# Patient Record
Sex: Female | Born: 1970 | Race: White | Hispanic: No | Marital: Married | Smoking: Current every day smoker
Health system: Southern US, Community
[De-identification: ages and names within clinical notes are randomized; demographics above are authoritative.]

## PROBLEM LIST (undated history)

## (undated) DIAGNOSIS — R87612 Low grade squamous intraepithelial lesion on cytologic smear of cervix (LGSIL): Secondary | ICD-10-CM

## (undated) DIAGNOSIS — G43909 Migraine, unspecified, not intractable, without status migrainosus: Secondary | ICD-10-CM

## (undated) DIAGNOSIS — N760 Acute vaginitis: Secondary | ICD-10-CM

## (undated) DIAGNOSIS — A630 Anogenital (venereal) warts: Secondary | ICD-10-CM

## (undated) DIAGNOSIS — E119 Type 2 diabetes mellitus without complications: Secondary | ICD-10-CM

## (undated) DIAGNOSIS — N871 Moderate cervical dysplasia: Secondary | ICD-10-CM

## (undated) DIAGNOSIS — R739 Hyperglycemia, unspecified: Secondary | ICD-10-CM

## (undated) DIAGNOSIS — E785 Hyperlipidemia, unspecified: Secondary | ICD-10-CM

## (undated) DIAGNOSIS — N63 Unspecified lump in unspecified breast: Secondary | ICD-10-CM

## (undated) DIAGNOSIS — N9089 Other specified noninflammatory disorders of vulva and perineum: Secondary | ICD-10-CM

## (undated) DIAGNOSIS — B9689 Other specified bacterial agents as the cause of diseases classified elsewhere: Secondary | ICD-10-CM

## (undated) DIAGNOSIS — R87619 Unspecified abnormal cytological findings in specimens from cervix uteri: Secondary | ICD-10-CM

## (undated) DIAGNOSIS — E559 Vitamin D deficiency, unspecified: Secondary | ICD-10-CM

## (undated) HISTORY — DX: Migraine, unspecified, not intractable, without status migrainosus: G43.909

## (undated) HISTORY — DX: Type 2 diabetes mellitus without complications: E11.9

## (undated) HISTORY — DX: Moderate cervical dysplasia: N87.1

## (undated) HISTORY — DX: Other specified bacterial agents as the cause of diseases classified elsewhere: B96.89

## (undated) HISTORY — PX: CERVICAL BIOPSY  W/ LOOP ELECTRODE EXCISION: SUR135

## (undated) HISTORY — DX: Low grade squamous intraepithelial lesion on cytologic smear of cervix (LGSIL): R87.612

## (undated) HISTORY — PX: COLPOSCOPY: SHX161

## (undated) HISTORY — DX: Hyperglycemia, unspecified: R73.9

## (undated) HISTORY — DX: Vitamin D deficiency, unspecified: E55.9

## (undated) HISTORY — DX: Unspecified abnormal cytological findings in specimens from cervix uteri: R87.619

## (undated) HISTORY — DX: Anogenital (venereal) warts: A63.0

## (undated) HISTORY — DX: Unspecified lump in unspecified breast: N63.0

## (undated) HISTORY — DX: Other specified bacterial agents as the cause of diseases classified elsewhere: N76.0

## (undated) HISTORY — DX: Other specified noninflammatory disorders of vulva and perineum: N90.89

## (undated) HISTORY — DX: Hyperlipidemia, unspecified: E78.5

---

## 1999-09-13 ENCOUNTER — Other Ambulatory Visit: Admission: RE | Admit: 1999-09-13 | Discharge: 1999-09-13 | Payer: Self-pay | Admitting: Family Medicine

## 2000-12-04 ENCOUNTER — Other Ambulatory Visit: Admission: RE | Admit: 2000-12-04 | Discharge: 2000-12-04 | Payer: Self-pay | Admitting: Family Medicine

## 2001-11-19 DIAGNOSIS — I10 Essential (primary) hypertension: Secondary | ICD-10-CM | POA: Insufficient documentation

## 2001-11-19 DIAGNOSIS — E785 Hyperlipidemia, unspecified: Secondary | ICD-10-CM | POA: Insufficient documentation

## 2002-06-20 DIAGNOSIS — F329 Major depressive disorder, single episode, unspecified: Secondary | ICD-10-CM | POA: Insufficient documentation

## 2004-01-17 ENCOUNTER — Ambulatory Visit: Payer: Self-pay | Admitting: Family Medicine

## 2005-03-01 ENCOUNTER — Ambulatory Visit: Payer: Self-pay | Admitting: Family Medicine

## 2005-06-19 ENCOUNTER — Encounter: Payer: Self-pay | Admitting: Family Medicine

## 2005-06-19 LAB — CONVERTED CEMR LAB: Pap Smear: NORMAL

## 2005-08-15 ENCOUNTER — Ambulatory Visit: Payer: Self-pay | Admitting: Family Medicine

## 2005-08-27 ENCOUNTER — Ambulatory Visit: Payer: Self-pay | Admitting: Family Medicine

## 2005-10-09 ENCOUNTER — Ambulatory Visit: Payer: Self-pay | Admitting: Family Medicine

## 2005-12-04 ENCOUNTER — Ambulatory Visit: Payer: Self-pay | Admitting: Family Medicine

## 2005-12-28 ENCOUNTER — Ambulatory Visit: Payer: Self-pay | Admitting: Family Medicine

## 2006-06-02 ENCOUNTER — Encounter: Payer: Self-pay | Admitting: Family Medicine

## 2006-06-03 ENCOUNTER — Ambulatory Visit: Payer: Self-pay | Admitting: Family Medicine

## 2006-06-03 ENCOUNTER — Encounter: Payer: Self-pay | Admitting: Family Medicine

## 2006-07-01 ENCOUNTER — Encounter: Payer: Self-pay | Admitting: Family Medicine

## 2006-09-04 ENCOUNTER — Encounter (INDEPENDENT_AMBULATORY_CARE_PROVIDER_SITE_OTHER): Payer: Self-pay | Admitting: *Deleted

## 2006-09-05 ENCOUNTER — Ambulatory Visit: Payer: Self-pay | Admitting: Family Medicine

## 2006-09-05 LAB — CONVERTED CEMR LAB
BUN: 8 mg/dL (ref 6–23)
CO2: 26 meq/L (ref 19–32)
Calcium: 8.7 mg/dL (ref 8.4–10.5)
Chloride: 105 meq/L (ref 96–112)
Cholesterol: 182 mg/dL (ref 0–200)
Creatinine, Ser: 0.7 mg/dL (ref 0.4–1.2)
GFR calc Af Amer: 122 mL/min
GFR calc non Af Amer: 101 mL/min
Glucose, Bld: 102 mg/dL — ABNORMAL HIGH (ref 70–99)
HDL: 30.5 mg/dL — ABNORMAL LOW (ref >39.0)
LDL Cholesterol: 116 mg/dL — ABNORMAL HIGH (ref 0–99)
Potassium: 4.3 meq/L (ref 3.5–5.1)
Sodium: 138 meq/L (ref 135–145)
TSH: 1.1 microintl units/mL (ref 0.35–5.50)
Total CHOL/HDL Ratio: 6
Triglycerides: 179 mg/dL — ABNORMAL HIGH (ref 0–149)
VLDL: 36 mg/dL (ref 0–40)

## 2006-09-09 ENCOUNTER — Ambulatory Visit: Payer: Self-pay | Admitting: Family Medicine

## 2009-09-26 ENCOUNTER — Encounter (INDEPENDENT_AMBULATORY_CARE_PROVIDER_SITE_OTHER): Payer: Self-pay | Admitting: *Deleted

## 2010-03-21 NOTE — Letter (Signed)
Summary: Lori Ray letter  [place] at [facility]  [address] [place], [place] [address]   Phone: [phone]  Fax: [phone]       09/26/2009 MRN: [id number]  Lori Ray [address] [place], [place]  [address]  Dear Ms. [facility] [place], and [place] announce the retirement of Arta Silence, M.D., from full-time practice at the [facility] office effective August 18, 2009 and his plans of returning part-time.  It is important to Dr. Hetty Ely and to our practice that you understand that [facility] has seven physicians in our office for your health care needs.  We will continue to offer the same exceptional care that you have today.    Dr. Hetty Ely has spoken to many of you about his plans for retirement and returning part-time in the fall.   We will continue to work with you through the transition to schedule appointments for you in the office and meet the high standards that [facility] is committed to.   Again, it is with great pleasure that we share the news that Dr. Hetty Ely will return to [facility] in October of 2011 with a reduced schedule.    If you have any questions, or would like to request an appointment with one of our physicians, please call us at [phone] and press the option for Scheduling an appointment.  We take pleasure in providing you with excellent patient care and look forward to seeing you at your next office visit.  Our [facility] Physicians are:  Tillman Abide, M.D. Laurita Quint, M.D. Roxy Manns, M.D. Kerby Nora, M.D. Hannah Beat, M.D. Ruthe Mannan, M.D. We proudly welcomed Raechel Ache, M.D. and Eustaquio Boyden, M.D. to the practice in July/August 2011.  Sincerely,  Lori Ray

## 2010-05-03 ENCOUNTER — Ambulatory Visit: Payer: Self-pay | Admitting: Unknown Physician Specialty

## 2010-05-15 ENCOUNTER — Ambulatory Visit: Payer: Self-pay | Admitting: Unknown Physician Specialty

## 2011-02-20 HISTORY — PX: BREAST CYST ASPIRATION: SHX578

## 2011-07-04 ENCOUNTER — Ambulatory Visit: Payer: Self-pay | Admitting: General Surgery

## 2015-09-05 ENCOUNTER — Other Ambulatory Visit: Payer: Self-pay | Admitting: Obstetrics and Gynecology

## 2015-09-22 ENCOUNTER — Other Ambulatory Visit: Payer: Self-pay | Admitting: Obstetrics and Gynecology

## 2015-09-22 DIAGNOSIS — Z1231 Encounter for screening mammogram for malignant neoplasm of breast: Secondary | ICD-10-CM

## 2015-10-13 ENCOUNTER — Other Ambulatory Visit: Payer: Self-pay | Admitting: Obstetrics and Gynecology

## 2015-10-13 ENCOUNTER — Ambulatory Visit
Admission: RE | Admit: 2015-10-13 | Discharge: 2015-10-13 | Disposition: A | Discharge status: Home / Self Care | Payer: Managed Care, Other (non HMO) | Source: Ambulatory Visit | Attending: Obstetrics and Gynecology | Admitting: Obstetrics and Gynecology

## 2015-10-13 DIAGNOSIS — Z1231 Encounter for screening mammogram for malignant neoplasm of breast: Secondary | ICD-10-CM

## 2016-10-16 ENCOUNTER — Ambulatory Visit: Payer: Self-pay | Admitting: Obstetrics and Gynecology

## 2016-11-27 ENCOUNTER — Ambulatory Visit: Payer: Self-pay | Admitting: Obstetrics and Gynecology

## 2017-01-02 ENCOUNTER — Encounter: Payer: Self-pay | Admitting: Obstetrics and Gynecology

## 2017-01-02 ENCOUNTER — Ambulatory Visit (INDEPENDENT_AMBULATORY_CARE_PROVIDER_SITE_OTHER): Payer: Managed Care, Other (non HMO) | Admitting: Obstetrics and Gynecology

## 2017-01-02 VITALS — BP 128/88 | Ht 65.0 in | Wt 288.0 lb

## 2017-01-02 DIAGNOSIS — Z01419 Encounter for gynecological examination (general) (routine) without abnormal findings: Secondary | ICD-10-CM

## 2017-01-02 DIAGNOSIS — Z716 Tobacco abuse counseling: Secondary | ICD-10-CM | POA: Insufficient documentation

## 2017-01-02 DIAGNOSIS — R635 Abnormal weight gain: Secondary | ICD-10-CM | POA: Diagnosis not present

## 2017-01-02 DIAGNOSIS — Z124 Encounter for screening for malignant neoplasm of cervix: Secondary | ICD-10-CM

## 2017-01-02 DIAGNOSIS — Z1151 Encounter for screening for human papillomavirus (HPV): Secondary | ICD-10-CM

## 2017-01-02 DIAGNOSIS — N912 Amenorrhea, unspecified: Secondary | ICD-10-CM

## 2017-01-02 DIAGNOSIS — Z1231 Encounter for screening mammogram for malignant neoplasm of breast: Secondary | ICD-10-CM | POA: Diagnosis not present

## 2017-01-02 DIAGNOSIS — Z1239 Encounter for other screening for malignant neoplasm of breast: Secondary | ICD-10-CM

## 2017-01-02 MED ORDER — MEDROXYPROGESTERONE ACETATE 10 MG PO TABS: 10.0000 mg | ORAL_TABLET | Freq: Every day | ORAL | 0 refills | Status: DC

## 2017-01-02 NOTE — Progress Notes (Signed)
PCP:  Ria Bush, MD   Chief Complaint  Patient presents with  . Annual Exam     HPI:      [name] is a 46 y.o. G0P0000 who LMP was Patient's last menstrual period was 12/21/2015., presents today for her annual examination.  Her menses are absent since 11/17. She was on OCPs until 7/18 and hasn't had a period off OCPs either. No BTB/spotting. She is having hot flashes. She has gained 22# since last yr. Hx of menorrhagia in the past.  Sex activity: single partner, contraception - vasectomy.  Last Pap: September 03, 2014  Results were: no abnormalities /neg HPV DNA. 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 Her mom is BRCA neg. There is no FH of ovarian cancer. The patient does do self-breast exams.  Tobacco use: The patient currently smokes 1 packs of cigarettes per day for the past many years. She quit for a few months last yr and then restarted. She knows she needs to quit. Alcohol use: none No drug use.  Exercise: not active  She does get adequate calcium and Vitamin D in her diet.  She has a hx of type 2 DM and hyperlipidemia and was on metformin and lovastatin but she lost a good bit of wt about 3 yrs ago. She has not been on meds in awhile. Recent labs though work showed HgA1 C of 5.7% and lipids were TC=209, TG=161, HDL=40, LDL=139.   Past Medical History:  Diagnosis Date  . Abnormal Pap smear of cervix   . Bacterial vaginosis   . Breast mass   . CIN II (cervical intraepithelial neoplasia II)   . Diabetes mellitus ([place])   . HPV (human papilloma virus) anogenital infection   . Hyperlipidemia   . LGSIL on Pap smear of cervix   . Migraine   . Vitamin D deficiency   . Vulvar lesion     Past Surgical History:  Procedure Laterality Date  . BREAST CYST ASPIRATION Left 2013  . CERVICAL BIOPSY  W/  LOOP ELECTRODE EXCISION    . COLPOSCOPY      Family History  Problem Relation Age of Onset  . Breast cancer Mother 24  . Congestive Heart Failure Father   . Diabetes Mellitus II Father   . Hypertension Father   . Rectal cancer Paternal Grandmother     Social History   Socioeconomic History  . Marital status: Single    Spouse name: Not on file  . Number of children: Not on file  . Years of education: Not on file  . Highest education level: Not on file  Social Needs  . Financial resource strain: Not on file  . Food insecurity - worry: Not on file  . Food insecurity - inability: Not on file  . Transportation needs - medical: Not on file  . Transportation needs - non-medical: Not on file  Occupational History  . Not on file  Tobacco Use  . Smoking status: Current Every Day Smoker  . Smokeless tobacco: Never Used  Substance and Sexual Activity  . Alcohol use: Yes  . Drug use: No  . Sexual activity: Yes    Birth control/protection: None  Other Topics Concern  . Not on file  Social History Narrative  . Not on  file    Current Meds  Medication Sig  . Cholecalciferol (VITAMIN D) 2000 units CAPS Take by mouth.  . Multiple Vitamin (MULTI-VITAMIN DAILY PO) Take by mouth.     ROS:  Review of Systems  Constitutional: Negative for fatigue, fever and unexpected weight change.  Respiratory: Negative for cough, shortness of breath and wheezing.   Cardiovascular: Negative for chest pain, palpitations and leg swelling.  Gastrointestinal: Negative for blood in stool, constipation, diarrhea, nausea and vomiting.  Endocrine: Negative for cold intolerance, heat intolerance and polyuria.  Genitourinary: Negative for dyspareunia, dysuria, flank pain, frequency, genital sores, hematuria, menstrual problem, pelvic pain, urgency, vaginal bleeding, vaginal discharge and vaginal pain.  Musculoskeletal: Negative for back pain, joint swelling and myalgias.  Skin: Negative for rash.    Neurological: Negative for dizziness, syncope, light-headedness, numbness and headaches.  Hematological: Negative for adenopathy.  Psychiatric/Behavioral: Negative for agitation, confusion, sleep disturbance and suicidal ideas. The patient is not nervous/anxious.      Objective: BP 128/88   Ht 5' 5"  (1.651 m)   Wt 288 lb (130.6 kg)   LMP 12/21/2015   BMI 47.93 kg/m    Physical Exam  Constitutional: She is oriented to person, place, and time. She appears well-developed and well-nourished.  Genitourinary: Vagina normal and uterus normal. There is no rash or tenderness on the right labia. There is no rash or tenderness on the left labia. No erythema or tenderness in the vagina. No vaginal discharge found. Right adnexum does not display mass and does not display tenderness. Left adnexum does not display mass and does not display tenderness. Cervix does not exhibit motion tenderness or polyp. Uterus is not enlarged or tender.  Neck: Normal range of motion. No thyromegaly present.  Cardiovascular: Normal rate, regular rhythm and normal heart sounds.  No murmur heard. Pulmonary/Chest: Effort normal and breath sounds normal. Right breast exhibits no mass, no nipple discharge, no skin change and no tenderness. Left breast exhibits no mass, no nipple discharge, no skin change and no tenderness.  Abdominal: Soft. There is no tenderness. There is no guarding.  Musculoskeletal: Normal range of motion.  Neurological: She is alert and oriented to person, place, and time. No cranial nerve deficit.  Psychiatric: She has a normal mood and affect. Her behavior is normal.  Vitals reviewed.   Assessment/Plan: Encounter for annual routine gynecological examination  Cervical cancer screening - Plan: IGP, Aptima HPV  Screening for HPV (human papillomavirus) - Plan: IGP, Aptima HPV  Screening for breast cancer - Pt to sched mammo. - Plan: MM SCREENING BREAST TOMO BILATERAL  Amenorrhea - Question  related to wt gain vs perimenopause. Try Rx provera challenge. If no menses, perimenopausal. If has withdrawal bleed, can restart OCPs vs provera Q3 mo - Plan: TSH, Prolactin, medroxyPROGESTERone (PROVERA) 10 MG tablet  Weight gain - Discussed diet/exercise/wt loss. Rechk labs next yr.  Encounter for tobacco use cessation counseling - Encouraged cessation.  Check labs.          GYN counsel mammography screening, menopause, adequate intake of calcium and vitamin D, diet and exercise     F/U  Return in about 1 year (around 01/02/2018).  Glen Kesinger B. Gisela Lea, PA-C 01/02/2017 11:05 AM

## 2017-01-02 NOTE — Patient Instructions (Signed)
I value your feedback and entrusting us with your care. If you get a [facility] patient survey, I would appreciate you taking the time to let us know about your experience today. Thank you! 

## 2017-01-03 LAB — TSH: TSH: 0.934 u[IU]/mL (ref 0.450–4.500)

## 2017-01-03 LAB — PROLACTIN: PROLACTIN: 7.1 ng/mL (ref 4.8–23.3)

## 2017-01-04 LAB — IGP, APTIMA HPV
HPV APTIMA: NEGATIVE
PAP Smear Comment: 0

## 2017-02-08 ENCOUNTER — Encounter: Payer: Self-pay | Admitting: Obstetrics and Gynecology

## 2017-02-08 ENCOUNTER — Ambulatory Visit
Admission: RE | Admit: 2017-02-08 | Discharge: 2017-02-08 | Disposition: A | Discharge status: Home / Self Care | Payer: Managed Care, Other (non HMO) | Source: Ambulatory Visit | Attending: Obstetrics and Gynecology | Admitting: Obstetrics and Gynecology

## 2017-02-08 DIAGNOSIS — Z1231 Encounter for screening mammogram for malignant neoplasm of breast: Secondary | ICD-10-CM | POA: Insufficient documentation

## 2017-02-08 DIAGNOSIS — Z1239 Encounter for other screening for malignant neoplasm of breast: Secondary | ICD-10-CM

## 2017-02-25 ENCOUNTER — Telehealth: Payer: Self-pay

## 2017-02-25 DIAGNOSIS — N912 Amenorrhea, unspecified: Secondary | ICD-10-CM

## 2017-02-25 MED ORDER — MEDROXYPROGESTERONE ACETATE 10 MG PO TABS: 10.0000 mg | ORAL_TABLET | Freq: Every day | ORAL | 0 refills | Status: DC

## 2017-02-25 NOTE — Telephone Encounter (Signed)
Pt completed course of Provera. Had a light 3 day cycle beginning 01/29/17. Nothing since. Pt inquiring next steps.

## 2017-02-25 NOTE — Telephone Encounter (Signed)
[facility] rx'd med in December & asked her to call back to f/u with how she was doing. Cb#[phone]

## 2017-02-25 NOTE — Telephone Encounter (Signed)
Pt without menses on OCPs since 11/17. Stopped them 7/18 without any bleeding since d/c'd. Pt with wt gain and given age, Rx provera challenge given to pt. Pt with 3 days light bleeding 01/29/17. Discussed OCPs again vs Q92mo provera. Pt elects to do provera. Rx eRxd. F/u prn.

## 2017-12-26 ENCOUNTER — Other Ambulatory Visit: Payer: Self-pay | Admitting: Obstetrics and Gynecology

## 2017-12-26 DIAGNOSIS — Z1231 Encounter for screening mammogram for malignant neoplasm of breast: Secondary | ICD-10-CM

## 2018-01-06 ENCOUNTER — Ambulatory Visit: Payer: Managed Care, Other (non HMO) | Admitting: Obstetrics and Gynecology

## 2018-02-10 ENCOUNTER — Ambulatory Visit
Admission: RE | Admit: 2018-02-10 | Discharge: 2018-02-10 | Disposition: A | Discharge status: Home / Self Care | Payer: 59 | Source: Ambulatory Visit | Attending: Obstetrics and Gynecology | Admitting: Obstetrics and Gynecology

## 2018-02-10 ENCOUNTER — Encounter: Payer: Self-pay | Admitting: Obstetrics and Gynecology

## 2018-02-10 DIAGNOSIS — Z1231 Encounter for screening mammogram for malignant neoplasm of breast: Secondary | ICD-10-CM | POA: Insufficient documentation

## 2018-03-03 ENCOUNTER — Ambulatory Visit: Payer: Managed Care, Other (non HMO) | Admitting: Obstetrics and Gynecology

## 2018-06-09 ENCOUNTER — Encounter: Payer: Self-pay | Admitting: Obstetrics and Gynecology

## 2018-07-28 ENCOUNTER — Ambulatory Visit (INDEPENDENT_AMBULATORY_CARE_PROVIDER_SITE_OTHER): Payer: 59 | Admitting: Obstetrics and Gynecology

## 2018-07-28 ENCOUNTER — Encounter: Payer: Self-pay | Admitting: Obstetrics and Gynecology

## 2018-07-28 ENCOUNTER — Other Ambulatory Visit (HOSPITAL_COMMUNITY)
Admission: RE | Admit: 2018-07-28 | Discharge: 2018-07-28 | Disposition: A | Discharge status: Home / Self Care | Payer: 59 | Source: Ambulatory Visit | Attending: Obstetrics and Gynecology | Admitting: Obstetrics and Gynecology

## 2018-07-28 ENCOUNTER — Other Ambulatory Visit: Payer: Self-pay

## 2018-07-28 VITALS — BP 118/90 | Ht 65.0 in | Wt 251.8 lb

## 2018-07-28 DIAGNOSIS — Z87410 Personal history of cervical dysplasia: Secondary | ICD-10-CM | POA: Diagnosis present

## 2018-07-28 DIAGNOSIS — Z01419 Encounter for gynecological examination (general) (routine) without abnormal findings: Secondary | ICD-10-CM | POA: Diagnosis not present

## 2018-07-28 DIAGNOSIS — Z124 Encounter for screening for malignant neoplasm of cervix: Secondary | ICD-10-CM

## 2018-07-28 DIAGNOSIS — Z1151 Encounter for screening for human papillomavirus (HPV): Secondary | ICD-10-CM | POA: Diagnosis present

## 2018-07-28 DIAGNOSIS — Z1239 Encounter for other screening for malignant neoplasm of breast: Secondary | ICD-10-CM

## 2018-07-28 DIAGNOSIS — N912 Amenorrhea, unspecified: Secondary | ICD-10-CM

## 2018-07-28 DIAGNOSIS — R102 Pelvic and perineal pain: Secondary | ICD-10-CM

## 2018-07-28 MED ORDER — MEDROXYPROGESTERONE ACETATE 10 MG PO TABS: 10.0000 mg | ORAL_TABLET | Freq: Every day | ORAL | 0 refills | Status: DC

## 2018-07-28 NOTE — Progress Notes (Signed)
PCP:  Chad Cordial, PA-C   Chief Complaint  Patient presents with  . Gynecologic Exam     HPI:      [name] is a 48 y.o. G0P0000 who LMP was Patient's last menstrual period was 05/14/2018 (approximate)., presents today for her annual examination.  Her menses were absent on OCPs since 11/17, but she didn't resume regular menses off OCPs once stopped 7/18. Had normal thyroid/pituitary labs 11/18 and had bleeding with provera challenge then. Since then, pt has been doing provera Q90 days if no spontaneous menses (didn't want to go back on OCPs). She is doing well with this. Menses last 4-5 days, normal flow, no BTB, no dysmen. Has lost 37# since we last saw her 11/18. Hx of menorrhagia in the past.  Sex activity: not currently active; partner with vasectomy in past Last Pap: 01/02/17 Results were: no abnormalities /neg HPV DNA. 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 Area was tender but improved. Sx recurred after sitting again in hard chair again 2/20. Sx come and go but sitting for extended time triggers sx. Pt feels like there is a hard area vaginally. Concerned about cystocele. Not sex active. No urin, bowel sx.  Last mammogram: 02/10/18  Results were: normal--routine follow-up in 12 months There is a FH of breast cancer in her mom at age 1. Her mom is BRCA neg. There is no FH of ovarian cancer. The patient does do self-breast exams.  Tobacco use: The patient currently smokes 1 packs of cigarettes per day for the past many years. She quit for a few months in past and then restarted. She knows she needs to quit. Alcohol use: none No drug use.  Exercise: mod active  She does get adequate calcium and Vitamin D in her diet.  She has a hx of type 2 DM and hyperlipidemia and was on metformin and lovastatin in past but lost a good bit of wt about 4 yrs ago. She has not been on  meds in awhile. Gets labs yearly at work (last one 11/19) which were normal.   Past Medical History:  Diagnosis Date  . Abnormal Pap smear of cervix   . Bacterial vaginosis   . Breast mass   . CIN II (cervical intraepithelial neoplasia II)   . Diabetes mellitus ([place])   . HPV (human papilloma virus) anogenital infection   . Hyperlipidemia   . LGSIL on Pap smear of cervix   . Migraine   . Vitamin D deficiency   . Vulvar lesion     Past Surgical History:  Procedure Laterality Date  . BREAST CYST ASPIRATION Left 2013  . CERVICAL BIOPSY  W/ LOOP ELECTRODE EXCISION    . COLPOSCOPY      Family History  Problem Relation Age of Onset  . Breast cancer Mother 73  . Congestive Heart Failure Father   . Diabetes Mellitus II Father   . Hypertension Father   . Rectal cancer Paternal Grandmother     Social History   Socioeconomic History  . Marital status: Single    Spouse name: Not on file  . Number of children: Not on file  . Years of education: Not on file  . Highest education level: Not on file  Occupational History  . Not on file  Social Needs  . Financial resource strain: Not  on file  . Food insecurity:    Worry: Not on file    Inability: Not on file  . Transportation needs:    Medical: Not on file    Non-medical: Not on file  Tobacco Use  . Smoking status: Current Every Day Smoker  . Smokeless tobacco: Never Used  Substance and Sexual Activity  . Alcohol use: Not Currently  . Drug use: No  . Sexual activity: Not Currently    Birth control/protection: None  Lifestyle  . Physical activity:    Days per week: Not on file    Minutes per session: Not on file  . Stress: Not on file  Relationships  . Social connections:    Talks on phone: Not on file    Gets together: Not on file    Attends religious service: Not on file    Active member of club or organization: Not on file    Attends meetings of clubs or organizations: Not on file    Relationship status: Not on  file  . Intimate partner violence:    Fear of current or ex partner: Not on file    Emotionally abused: Not on file    Physically abused: Not on file    Forced sexual activity: Not on file  Other Topics Concern  . Not on file  Social History Narrative  . Not on file    Current Meds  Medication Sig  . Cholecalciferol (VITAMIN D) 2000 units CAPS Take by mouth.  . Multiple Vitamin (MULTI-VITAMIN DAILY PO) Take by mouth.     ROS:  Review of Systems  Constitutional: Negative for fatigue, fever and unexpected weight change.  Respiratory: Negative for cough, shortness of breath and wheezing.   Cardiovascular: Negative for chest pain, palpitations and leg swelling.  Gastrointestinal: Negative for blood in stool, constipation, diarrhea, nausea and vomiting.  Endocrine: Negative for cold intolerance, heat intolerance and polyuria.  Genitourinary: Negative for dyspareunia, dysuria, flank pain, frequency, genital sores, hematuria, menstrual problem, pelvic pain, urgency, vaginal bleeding, vaginal discharge and vaginal pain.  Musculoskeletal: Negative for back pain, joint swelling and myalgias.  Skin: Negative for rash.  Neurological: Negative for dizziness, syncope, light-headedness, numbness and headaches.  Hematological: Negative for adenopathy.  Psychiatric/Behavioral: Negative for agitation, confusion, sleep disturbance and suicidal ideas. The patient is not nervous/anxious.      Objective: BP 118/90   Ht 5' 5" (1.651 m)   Wt 251 lb 12.8 oz (114.2 kg)   LMP 05/14/2018 (Approximate)   BMI 41.90 kg/m    Physical Exam Constitutional:      Appearance: She is well-developed.  Genitourinary:     Vulva, vagina, cervix, uterus, right adnexa and left adnexa normal.     No vulval lesion or tenderness noted.     No vaginal discharge, erythema or tenderness.     No cervical polyp.     Uterus is not enlarged or tender.     No right or left adnexal mass present.     Right adnexa not  tender.     Left adnexa not tender.     Genitourinary Comments: NO EVID OF CYSTOCELE/RECTOCELE/UTERINE PROLAPSE WITH VALSALVA; NO BULGE IDENTIFIED ON EXAM  Neck:     Musculoskeletal: Normal range of motion.     Thyroid: No thyromegaly.  Cardiovascular:     Rate and Rhythm: Normal rate and regular rhythm.     Heart sounds: Normal heart sounds. No murmur.  Pulmonary:     Effort: Pulmonary effort is  normal.     Breath sounds: Normal breath sounds.  Chest:     Breasts:        Right: No mass, nipple discharge, skin change or tenderness.        Left: No mass, nipple discharge, skin change or tenderness.  Abdominal:     Palpations: Abdomen is soft.     Tenderness: There is no abdominal tenderness. There is no guarding.  Musculoskeletal: Normal range of motion.  Neurological:     General: No focal deficit present.     Mental Status: She is alert and oriented to person, place, and time.     Cranial Nerves: No cranial nerve deficit.  Skin:    General: Skin is warm and dry.  Psychiatric:        Mood and Affect: Mood normal.        Behavior: Behavior normal.        Thought Content: Thought content normal.        Judgment: Judgment normal.  Vitals signs reviewed.     Assessment/Plan: Encounter for annual routine gynecological examination  Cervical cancer screening - Plan: Cytology - PAP  Screening for HPV (human papillomavirus) - Plan: Cytology - PAP  History of cervical dysplasia - Plan: Cytology - PAP  Screening for breast cancer - Mammo due 12/20 - Plan: MM 3D SCREEN BREAST BILATERAL  Amenorrhea - Question related to wt gain vs perimenopause. Cont provera Q3 mo if no spontaneous menses, Rx eRxd. - Plan: medroxyPROGESTERone (PROVERA) 10 MG tablet  Vaginal pain - Normal gyn exam. No evid of pelvic relaxation/prolapse/cystocele/rectocele. Question pelvic floor MSK sx. Recommend stretches. Can refer to PT prn.  Check labs.          GYN counsel mammography screening, menopause,  adequate intake of calcium and vitamin D, diet and exercise     F/U  Return in about 1 year (around 07/28/2019).  Alicia B. Copland, PA-C 07/28/2018 3:03 PM

## 2018-07-28 NOTE — Patient Instructions (Signed)
I value your feedback and entrusting us with your care. If you get a [facility] patient survey, I would appreciate you taking the time to let us know about your experience today. Thank you! 

## 2018-07-31 LAB — CYTOLOGY - PAP
Diagnosis: NEGATIVE
HPV: NOT DETECTED

## 2019-02-16 ENCOUNTER — Ambulatory Visit
Admission: RE | Admit: 2019-02-16 | Discharge: 2019-02-16 | Disposition: A | Discharge status: Home / Self Care | Payer: Managed Care, Other (non HMO) | Source: Ambulatory Visit | Attending: Obstetrics and Gynecology | Admitting: Obstetrics and Gynecology

## 2019-02-16 ENCOUNTER — Encounter: Payer: Self-pay | Admitting: Obstetrics and Gynecology

## 2019-02-16 DIAGNOSIS — Z1239 Encounter for other screening for malignant neoplasm of breast: Secondary | ICD-10-CM

## 2019-02-16 DIAGNOSIS — Z1231 Encounter for screening mammogram for malignant neoplasm of breast: Secondary | ICD-10-CM | POA: Insufficient documentation

## 2019-07-14 ENCOUNTER — Other Ambulatory Visit: Payer: Self-pay | Admitting: Obstetrics and Gynecology

## 2019-07-14 DIAGNOSIS — N912 Amenorrhea, unspecified: Secondary | ICD-10-CM

## 2019-07-28 NOTE — Progress Notes (Signed)
PCP:  Chad Cordial, PA-C   Chief Complaint  Patient presents with   Gynecologic Exam     HPI:      Lori Ray is a 49 y.o. G0P0000 who LMP was Patient's last menstrual period was 05/25/2019 (approximate)., presents today for her annual examination.  Her menses are Q3 months with provera challenge, lasting 2 days, very light, no dysmen, no BTB.  Had been amenorrheic on OCPs 2017 but no menses off pills. Tried prog challenge and pt had withdrawal bleed. She didn't want to restart OCPs, so cont Q3 months provera at this time given age/BMI.  Hx of menorrhagia in the past.  Sex activity: currently active; partner with vasectomy. Has small cyst on LT labia minora. Stable.  Last Pap: 07/28/18 Results were: no abnormalities /neg HPV DNA. 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 Neg exam last yr. Pt states sx resolved except when a little constipated and is more rectal in nature. Sx resolve if she has BM. No urin or bowel issues.  Last mammogram: 02/16/19  Results were: normal--routine follow-up in 12 months There is a FH of breast cancer in her mom at age 67. Her mom is BRCA neg. There is no FH of ovarian cancer. The patient does do self-breast exams.  Tobacco use: The patient currently smokes 1/2 ppd, knows she needs to quit. Alcohol use: none No drug use.  Exercise: min active. Having knee pain.  Colonoscopy: never  She does get adequate calcium and Vitamin D in her diet.  She has a hx of type 2 DM and hyperlipidemia and was on metformin and lovastatin in past but lost a good bit of wt about 5 yrs ago. She has not been on meds in awhile. Gets labs yearly at work (last one 2020) which were normal. Plans to do soon.  Tried to give blood recently and was denied due to HgB. Take MVI with iron daily.   Past Medical History:  Diagnosis Date   Abnormal Pap smear of cervix     Bacterial vaginosis    Breast mass    CIN II (cervical intraepithelial neoplasia II)    Diabetes mellitus (HCC)    HPV (human papilloma virus) anogenital infection    Hyperlipidemia    LGSIL on Pap smear of cervix    Migraine    Vitamin D deficiency    Vulvar lesion     Past Surgical History:  Procedure Laterality Date   BREAST CYST ASPIRATION Left 2013   CERVICAL BIOPSY  W/ LOOP ELECTRODE EXCISION     COLPOSCOPY      Family History  Problem Relation Age of Onset   Breast cancer Mother 33   Congestive Heart Failure Father    Diabetes Mellitus II Father    Hypertension Father    Rectal cancer Paternal Grandmother     Social History   Socioeconomic History   Marital status: Single    Spouse name: Not on file   Number of children: Not on file   Years of education: Not on file   Highest education level: Not on file  Occupational History   Not on file  Tobacco Use   Smoking status: Current Every Day Smoker   Smokeless tobacco: Never Used  Substance and Sexual Activity   Alcohol use: Not Currently   Drug use: No  Sexual activity: Yes    Birth control/protection: None  Other Topics Concern   Not on file  Social History Narrative   Not on file   Social Determinants of Health   Financial Resource Strain:    Difficulty of Paying Living Expenses:   Food Insecurity:    Worried About Charity fundraiser in the Last Year:    Arboriculturist in the Last Year:   Transportation Needs:    Film/video editor (Medical):    Lack of Transportation (Non-Medical):   Physical Activity:    Days of Exercise per Week:    Minutes of Exercise per Session:   Stress:    Feeling of Stress :   Social Connections:    Frequency of Communication with Friends and Family:    Frequency of Social Gatherings with Friends and Family:    Attends Religious Services:    Active Member of Clubs or Organizations:    Attends Arts administrator:    Marital Status:   Intimate Partner Violence:    Fear of Current or Ex-Partner:    Emotionally Abused:    Physically Abused:    Sexually Abused:     Current Meds  Medication Sig   Cholecalciferol (VITAMIN D) 2000 units CAPS Take by mouth.   medroxyPROGESTERone (PROVERA) 10 MG tablet TAKE 1 TABLET BY MOUTH ONCE DAILY FOR 7 DAYS EVERY  90  DAYS  IF  NO  MENSES   Multiple Vitamin (MULTI-VITAMIN DAILY PO) Take by mouth.   [DISCONTINUED] medroxyPROGESTERone (PROVERA) 10 MG tablet TAKE 1 TABLET BY MOUTH ONCE DAILY FOR 7 DAYS EVERY  90  DAYS  IF  NO  MENSES     ROS:  Review of Systems  Constitutional: Negative for fatigue, fever and unexpected weight change.  Respiratory: Negative for cough, shortness of breath and wheezing.   Cardiovascular: Negative for chest pain, palpitations and leg swelling.  Gastrointestinal: Negative for blood in stool, constipation, diarrhea, nausea and vomiting.  Endocrine: Negative for cold intolerance, heat intolerance and polyuria.  Genitourinary: Negative for dyspareunia, dysuria, flank pain, frequency, genital sores, hematuria, menstrual problem, pelvic pain, urgency, vaginal bleeding, vaginal discharge and vaginal pain.  Musculoskeletal: Positive for arthralgias. Negative for back pain, joint swelling and myalgias.  Skin: Negative for rash.  Neurological: Negative for dizziness, syncope, light-headedness, numbness and headaches.  Hematological: Negative for adenopathy.  Psychiatric/Behavioral: Negative for agitation, confusion, sleep disturbance and suicidal ideas. The patient is not nervous/anxious.      Objective: BP 120/80    Ht 5' 4"  (1.626 m)    Wt 267 lb (121.1 kg)    LMP 05/25/2019 (Approximate)    BMI 45.83 kg/m    Physical Exam Constitutional:      Appearance: She is well-developed.  Genitourinary:     Vulva, vagina, cervix, uterus, right adnexa and left adnexa normal.     No vulval lesion or tenderness noted.         No vaginal discharge, erythema or tenderness.     No cervical polyp.     Uterus is not enlarged or tender.     No right or left adnexal mass present.     Right adnexa not tender.     Left adnexa not tender.  Neck:     Thyroid: No thyromegaly.  Cardiovascular:     Rate and Rhythm: Normal rate and regular rhythm.     Heart sounds: Normal heart sounds. No murmur.  Pulmonary:  Effort: Pulmonary effort is normal.     Breath sounds: Normal breath sounds.  Chest:     Breasts:        Right: No mass, nipple discharge, skin change or tenderness.        Left: No mass, nipple discharge, skin change or tenderness.  Abdominal:     Palpations: Abdomen is soft.     Tenderness: There is no abdominal tenderness. There is no guarding.  Musculoskeletal:        General: Normal range of motion.     Cervical back: Normal range of motion.  Neurological:     General: No focal deficit present.     Mental Status: She is alert and oriented to person, place, and time.     Cranial Nerves: No cranial nerve deficit.  Skin:    General: Skin is warm and dry.  Psychiatric:        Mood and Affect: Mood normal.        Behavior: Behavior normal.        Thought Content: Thought content normal.        Judgment: Judgment normal.  Vitals reviewed.     Assessment/Plan: Encounter for annual routine gynecological examination  Cervical cancer screening - Plan: Cytology - PAP  History of cervical dysplasia - Plan: Cytology - PAP; Repeat pap today. Will f/u if abn.   Encounter for screening mammogram for malignant neoplasm of breast - Plan: MM 3D SCREEN BREAST BILATERAL; pt to sched mammo  Amenorrhea - Plan: medroxyPROGESTERone (PROVERA) 10 MG tablet; Rx RF provera since still having withdrawal bleed. Re-eval next yr or if bleeding stops.   Screening for colon cancer - Plan: Ambulatory referral to Gastroenterology; refer to GI for scr colonoscopy due to age.  Screening for deficiency anemia - Plan:  CBC; couldn't give blood recently. Taking MVI with iron.           GYN counsel mammography screening, menopause, adequate intake of calcium and vitamin D, diet and exercise     F/U  Return in about 1 year (around 07/28/2020).  Sheniah Supak B. Stayce Delancy, PA-C 07/29/2019 4:36 PM

## 2019-07-28 NOTE — Patient Instructions (Addendum)
I value your feedback and entrusting us with your care. If you get a [facility] patient survey, I would appreciate you taking the time to let us know about your experience today. Thank you! ° °As of January 29, 2019, your lab results will be released to your MyChart immediately, before I even have a chance to see them. Please give me time to review them and contact you if there are any abnormalities. Thank you for your patience.  ° °[facility]: [phone] ° ° ° °

## 2019-07-29 ENCOUNTER — Encounter: Payer: Self-pay | Admitting: Obstetrics and Gynecology

## 2019-07-29 ENCOUNTER — Other Ambulatory Visit: Payer: Self-pay

## 2019-07-29 ENCOUNTER — Other Ambulatory Visit (HOSPITAL_COMMUNITY)
Admission: RE | Admit: 2019-07-29 | Discharge: 2019-07-29 | Disposition: A | Discharge status: Home / Self Care | Payer: 59 | Source: Ambulatory Visit | Attending: Obstetrics and Gynecology | Admitting: Obstetrics and Gynecology

## 2019-07-29 ENCOUNTER — Ambulatory Visit (INDEPENDENT_AMBULATORY_CARE_PROVIDER_SITE_OTHER): Payer: 59 | Admitting: Obstetrics and Gynecology

## 2019-07-29 VITALS — BP 120/80 | Ht 64.0 in | Wt 267.0 lb

## 2019-07-29 DIAGNOSIS — Z124 Encounter for screening for malignant neoplasm of cervix: Secondary | ICD-10-CM | POA: Diagnosis present

## 2019-07-29 DIAGNOSIS — Z1231 Encounter for screening mammogram for malignant neoplasm of breast: Secondary | ICD-10-CM | POA: Diagnosis not present

## 2019-07-29 DIAGNOSIS — N912 Amenorrhea, unspecified: Secondary | ICD-10-CM | POA: Insufficient documentation

## 2019-07-29 DIAGNOSIS — Z130 Encounter for screening for diseases of the blood and blood-forming organs and certain disorders involving the immune mechanism: Secondary | ICD-10-CM

## 2019-07-29 DIAGNOSIS — Z01419 Encounter for gynecological examination (general) (routine) without abnormal findings: Secondary | ICD-10-CM

## 2019-07-29 DIAGNOSIS — Z87410 Personal history of cervical dysplasia: Secondary | ICD-10-CM | POA: Insufficient documentation

## 2019-07-29 DIAGNOSIS — Z1211 Encounter for screening for malignant neoplasm of colon: Secondary | ICD-10-CM

## 2019-07-29 MED ORDER — MEDROXYPROGESTERONE ACETATE 10 MG PO TABS: ORAL_TABLET | ORAL | 0 refills | Status: DC

## 2019-07-30 ENCOUNTER — Encounter: Payer: Self-pay | Admitting: Obstetrics and Gynecology

## 2019-07-30 LAB — CBC
Hematocrit: 44.4 % (ref 34.0–46.6)
Hemoglobin: 14.2 g/dL (ref 11.1–15.9)
MCH: 30.5 pg (ref 26.6–33.0)
MCHC: 32 g/dL (ref 31.5–35.7)
MCV: 95 fL (ref 79–97)
Platelets: 325 10*3/uL (ref 150–450)
RBC: 4.66 x10E6/uL (ref 3.77–5.28)
RDW: 12.3 % (ref 11.7–15.4)
WBC: 8 10*3/uL (ref 3.4–10.8)

## 2019-07-30 LAB — CYTOLOGY - PAP
Adequacy: ABSENT
Diagnosis: NEGATIVE

## 2019-08-03 ENCOUNTER — Other Ambulatory Visit: Payer: Self-pay

## 2019-08-03 ENCOUNTER — Telehealth (INDEPENDENT_AMBULATORY_CARE_PROVIDER_SITE_OTHER): Payer: Self-pay | Admitting: Gastroenterology

## 2019-08-03 DIAGNOSIS — Z1211 Encounter for screening for malignant neoplasm of colon: Secondary | ICD-10-CM

## 2019-08-03 NOTE — Progress Notes (Signed)
Gastroenterology Pre-Procedure Review  Request Date: Monday 09/07/19 Requesting Physician: Dr. Bonna Gains  PATIENT REVIEW QUESTIONS: The patient responded to the following health history questions as indicated:    1. Are you having any GI issues? no 2. Do you have a personal history of Polyps? no 3. Do you have a family history of Colon Cancer or Polyps? no 4. Diabetes Mellitus? no 5. Joint replacements in the past 12 months?no 6. Major health problems in the past 3 months?no 7. Any artificial heart valves, MVP, or defibrillator?no    MEDICATIONS & ALLERGIES:    Patient reports the following regarding taking any anticoagulation/antiplatelet therapy:   Plavix, Coumadin, Eliquis, Xarelto, Lovenox, Pradaxa, Brilinta, or Effient? no Aspirin? no  Patient confirms/reports the following medications:  Current Outpatient Medications  Medication Sig Dispense Refill  . Cholecalciferol (VITAMIN D) 2000 units CAPS Take by mouth.    . medroxyPROGESTERone (PROVERA) 10 MG tablet TAKE 1 TABLET BY MOUTH ONCE DAILY FOR 7 DAYS EVERY  90  DAYS  IF  NO  MENSES 28 tablet 0  . Multiple Vitamin (MULTI-VITAMIN DAILY PO) Take by mouth.    . Omega-3 Fatty Acids (FISH OIL PO) Take by mouth.     No current facility-administered medications for this visit.    Patient confirms/reports the following allergies:  Allergies  Allergen Reactions  . Sulfadiazine     No orders of the defined types were placed in this encounter.   AUTHORIZATION INFORMATION Primary Insurance: 1D#: Group #:  Secondary Insurance: 1D#: Group #:  SCHEDULE INFORMATION: Date:  Time: Location:

## 2019-08-20 DIAGNOSIS — K635 Polyp of colon: Secondary | ICD-10-CM

## 2019-08-20 HISTORY — DX: Polyp of colon: K63.5

## 2019-08-31 ENCOUNTER — Encounter: Payer: Self-pay | Admitting: Gastroenterology

## 2019-08-31 ENCOUNTER — Other Ambulatory Visit: Payer: Self-pay

## 2019-09-03 ENCOUNTER — Other Ambulatory Visit: Payer: Self-pay

## 2019-09-03 ENCOUNTER — Other Ambulatory Visit
Admission: RE | Admit: 2019-09-03 | Discharge: 2019-09-03 | Disposition: A | Discharge status: Home / Self Care | Payer: 59 | Source: Ambulatory Visit | Attending: Gastroenterology | Admitting: Gastroenterology

## 2019-09-03 DIAGNOSIS — Z20822 Contact with and (suspected) exposure to covid-19: Secondary | ICD-10-CM | POA: Diagnosis not present

## 2019-09-03 LAB — SARS CORONAVIRUS 2 (TAT 6-24 HRS): SARS Coronavirus 2: NEGATIVE

## 2019-09-04 NOTE — Discharge Instructions (Signed)
General Anesthesia, Adult, Care After This sheet gives you information about how to care for yourself after your procedure. Your health care provider may also give you more specific instructions. If you have problems or questions, contact your health care provider. What can I expect after the procedure? After the procedure, the following side effects are common:  Pain or discomfort at the IV site.  Nausea.  Vomiting.  Sore throat.  Trouble concentrating.  Feeling cold or chills.  Weak or tired.  Sleepiness and fatigue.  Soreness and body aches. These side effects can affect parts of the body that were not involved in surgery. Follow these instructions at home:  For at least 24 hours after the procedure:  Have a responsible adult stay with you. It is important to have someone help care for you until you are awake and alert.  Rest as needed.  Do not: ? Participate in activities in which you could fall or become injured. ? Drive. ? Use heavy machinery. ? Drink alcohol. ? Take sleeping pills or medicines that cause drowsiness. ? Make important decisions or sign legal documents. ? Take care of children on your own. Eating and drinking  Follow any instructions from your health care provider about eating or drinking restrictions.  When you feel hungry, start by eating small amounts of foods that are soft and easy to digest (bland), such as toast. Gradually return to your regular diet.  Drink enough fluid to keep your urine pale yellow.  If you vomit, rehydrate by drinking water, juice, or clear broth. General instructions  If you have sleep apnea, surgery and certain medicines can increase your risk for breathing problems. Follow instructions from your health care provider about wearing your sleep device: ? Anytime you are sleeping, including during daytime naps. ? While taking prescription pain medicines, sleeping medicines, or medicines that make you drowsy.  Return to  your normal activities as told by your health care provider. Ask your health care provider what activities are safe for you.  Take over-the-counter and prescription medicines only as told by your health care provider.  If you smoke, do not smoke without supervision.  Keep all follow-up visits as told by your health care provider. This is important. Contact a health care provider if:  You have nausea or vomiting that does not get better with medicine.  You cannot eat or drink without vomiting.  You have pain that does not get better with medicine.  You are unable to pass urine.  You develop a skin rash.  You have a fever.  You have redness around your IV site that gets worse. Get help right away if:  You have difficulty breathing.  You have chest pain.  You have blood in your urine or stool, or you vomit blood. Summary  After the procedure, it is common to have a sore throat or nausea. It is also common to feel tired.  Have a responsible adult stay with you for the first 24 hours after general anesthesia. It is important to have someone help care for you until you are awake and alert.  When you feel hungry, start by eating small amounts of foods that are soft and easy to digest (bland), such as toast. Gradually return to your regular diet.  Drink enough fluid to keep your urine pale yellow.  Return to your normal activities as told by your health care provider. Ask your health care provider what activities are safe for you. This information is not   intended to replace advice given to you by your health care provider. Make sure you discuss any questions you have with your health care provider. Document Revised: 02/08/2017 Document Reviewed: 09/21/2016 Elsevier Patient Education  2020 Elsevier Inc.  

## 2019-09-07 ENCOUNTER — Encounter
Admission: RE | Disposition: A | Discharge status: Home / Self Care | Payer: Self-pay | Source: Home / Self Care | Attending: Gastroenterology

## 2019-09-07 ENCOUNTER — Ambulatory Visit: Payer: 59 | Admitting: Anesthesiology

## 2019-09-07 ENCOUNTER — Other Ambulatory Visit: Payer: Self-pay | Admitting: Gastroenterology

## 2019-09-07 ENCOUNTER — Other Ambulatory Visit: Payer: Self-pay

## 2019-09-07 ENCOUNTER — Encounter: Payer: Self-pay | Admitting: Gastroenterology

## 2019-09-07 ENCOUNTER — Ambulatory Visit
Admission: RE | Admit: 2019-09-07 | Discharge: 2019-09-07 | Disposition: A | Discharge status: Home / Self Care | Payer: 59 | Attending: Gastroenterology | Admitting: Gastroenterology

## 2019-09-07 DIAGNOSIS — E785 Hyperlipidemia, unspecified: Secondary | ICD-10-CM | POA: Insufficient documentation

## 2019-09-07 DIAGNOSIS — Z79899 Other long term (current) drug therapy: Secondary | ICD-10-CM | POA: Insufficient documentation

## 2019-09-07 DIAGNOSIS — E119 Type 2 diabetes mellitus without complications: Secondary | ICD-10-CM | POA: Diagnosis not present

## 2019-09-07 DIAGNOSIS — E669 Obesity, unspecified: Secondary | ICD-10-CM | POA: Insufficient documentation

## 2019-09-07 DIAGNOSIS — Z6841 Body Mass Index (BMI) 40.0 and over, adult: Secondary | ICD-10-CM | POA: Insufficient documentation

## 2019-09-07 DIAGNOSIS — I10 Essential (primary) hypertension: Secondary | ICD-10-CM | POA: Insufficient documentation

## 2019-09-07 DIAGNOSIS — Z1211 Encounter for screening for malignant neoplasm of colon: Secondary | ICD-10-CM

## 2019-09-07 DIAGNOSIS — D123 Benign neoplasm of transverse colon: Secondary | ICD-10-CM | POA: Diagnosis not present

## 2019-09-07 DIAGNOSIS — D125 Benign neoplasm of sigmoid colon: Secondary | ICD-10-CM | POA: Insufficient documentation

## 2019-09-07 DIAGNOSIS — F329 Major depressive disorder, single episode, unspecified: Secondary | ICD-10-CM | POA: Diagnosis not present

## 2019-09-07 DIAGNOSIS — Z793 Long term (current) use of hormonal contraceptives: Secondary | ICD-10-CM | POA: Diagnosis not present

## 2019-09-07 DIAGNOSIS — F17210 Nicotine dependence, cigarettes, uncomplicated: Secondary | ICD-10-CM | POA: Diagnosis not present

## 2019-09-07 DIAGNOSIS — E559 Vitamin D deficiency, unspecified: Secondary | ICD-10-CM | POA: Insufficient documentation

## 2019-09-07 DIAGNOSIS — K635 Polyp of colon: Secondary | ICD-10-CM

## 2019-09-07 DIAGNOSIS — D124 Benign neoplasm of descending colon: Secondary | ICD-10-CM | POA: Insufficient documentation

## 2019-09-07 DIAGNOSIS — Z7982 Long term (current) use of aspirin: Secondary | ICD-10-CM | POA: Diagnosis not present

## 2019-09-07 DIAGNOSIS — D122 Benign neoplasm of ascending colon: Secondary | ICD-10-CM | POA: Diagnosis not present

## 2019-09-07 HISTORY — PX: POLYPECTOMY: SHX5525

## 2019-09-07 HISTORY — PX: COLONOSCOPY WITH PROPOFOL: SHX5780

## 2019-09-07 LAB — POCT PREGNANCY, URINE: Preg Test, Ur: NEGATIVE

## 2019-09-07 SURGERY — COLONOSCOPY WITH PROPOFOL
Anesthesia: General | Site: Rectum

## 2019-09-07 MED ORDER — ONDANSETRON HCL 4 MG/2ML IJ SOLN: 4.0000 mg | Freq: Once | INTRAMUSCULAR | Status: DC | PRN

## 2019-09-07 MED ORDER — LACTATED RINGERS IV SOLN: INTRAVENOUS | Status: DC

## 2019-09-07 MED ORDER — PROPOFOL 10 MG/ML IV BOLUS
INTRAVENOUS | Status: DC | PRN
  Administered 2019-09-07: 30 mg via INTRAVENOUS
  Administered 2019-09-07: 40 mg via INTRAVENOUS
  Administered 2019-09-07: 50 mg via INTRAVENOUS
  Administered 2019-09-07: 30 mg via INTRAVENOUS
  Administered 2019-09-07: 40 mg via INTRAVENOUS
  Administered 2019-09-07: 20 mg via INTRAVENOUS
  Administered 2019-09-07 (×3): 30 mg via INTRAVENOUS
  Administered 2019-09-07: 150 mg via INTRAVENOUS

## 2019-09-07 MED ORDER — SODIUM CHLORIDE 0.9 % IV SOLN: INTRAVENOUS | Status: DC

## 2019-09-07 MED ORDER — LIDOCAINE HCL (CARDIAC) PF 100 MG/5ML IV SOSY
PREFILLED_SYRINGE | INTRAVENOUS | Status: DC | PRN
  Administered 2019-09-07: 30 mg via INTRAVENOUS

## 2019-09-07 MED ORDER — ACETAMINOPHEN 10 MG/ML IV SOLN: 1000.0000 mg | Freq: Once | INTRAVENOUS | Status: DC | PRN

## 2019-09-07 MED ORDER — STERILE WATER FOR IRRIGATION IR SOLN: Status: DC | PRN

## 2019-09-07 SURGICAL SUPPLY — 10 items
FCP ESCP3.2XJMB 240X2.8X (MISCELLANEOUS) ×2
FORCEPS BIOP RJ4 240 W/NDL (MISCELLANEOUS) ×3
FORCEPS ESCP3.2XJMB 240X2.8X (MISCELLANEOUS) IMPLANT
GOWN CVR UNV OPN BCK APRN NK (MISCELLANEOUS) ×4 IMPLANT
GOWN ISOL THUMB LOOP REG UNIV (MISCELLANEOUS) ×6
KIT ENDO PROCEDURE OLY (KITS) ×3 IMPLANT
MANIFOLD NEPTUNE II (INSTRUMENTS) ×3 IMPLANT
SNARE COLD EXACTO (MISCELLANEOUS) ×1 IMPLANT
TRAP ETRAP POLY (MISCELLANEOUS) ×1 IMPLANT
WATER STERILE IRR 250ML POUR (IV SOLUTION) ×3 IMPLANT

## 2019-09-07 NOTE — Transfer of Care (Signed)
Immediate Anesthesia Transfer of Care Note  Patient: Lori Ray  Procedure(s) Performed: COLONOSCOPY WITH PROPOFOL (N/A ) POLYPECTOMY (N/A Rectum)  Patient Location: PACU  Anesthesia Type: General  Level of Consciousness: awake, alert  and patient cooperative  Airway and Oxygen Therapy: Patient Spontanous Breathing and Patient connected to supplemental oxygen  Post-op Assessment: Post-op Vital signs reviewed, Patient's Cardiovascular Status Stable, Respiratory Function Stable, Patent Airway and No signs of Nausea or vomiting  Post-op Vital Signs: Reviewed and stable  Complications: No complications documented.

## 2019-09-07 NOTE — H&P (Signed)
Vonda Antigua, MD [address], [place], [place], [place], [address] [address], [place], [place], [place], [address] Phone: [phone]  Fax: [phone]  Primary Care Physician:  Chad Cordial, PA-C   Pre-Procedure History & Physical: HPI:  Lori Ray is a 49 y.o. female is here for a colonoscopy.   Past Medical History:  Diagnosis Date  . Abnormal Pap smear of cervix   . Bacterial vaginosis   . Breast mass   . CIN II (cervical intraepithelial neoplasia II)   . Diabetes mellitus ([place])    "pre-diabetic"   . HPV (human papilloma virus) anogenital infection   . Hyperlipidemia   . LGSIL on Pap smear of cervix   . Migraine    "hormonal"  . Vitamin D deficiency   . Vulvar lesion     Past Surgical History:  Procedure Laterality Date  . BREAST CYST ASPIRATION Left 2013  . CERVICAL BIOPSY  W/ LOOP ELECTRODE EXCISION    . COLPOSCOPY      Prior to Admission medications   Medication Sig Start Date End Date Taking? Authorizing Provider  aspirin-acetaminophen-caffeine (EXCEDRIN MIGRAINE) [phone] MG tablet Take by mouth every 6 (six) hours as needed for headache.   Yes [provider]  Cholecalciferol (VITAMIN D) 2000 units CAPS Take by mouth.   Yes [provider]  medroxyPROGESTERone (PROVERA) 10 MG tablet TAKE 1 TABLET BY MOUTH ONCE DAILY FOR 7 DAYS EVERY  90  DAYS  IF  NO  MENSES 10/25/26  Yes Copland, Alicia B, PA-C  Multiple Vitamin (MULTI-VITAMIN DAILY PO) Take by mouth.   Yes [provider]  Omega-3 Fatty Acids (FISH OIL PO) Take by mouth.   Yes [provider]    Allergies as of 08/03/2019 - Review Complete 08/03/2019  Allergen Reaction Noted  . Sulfadiazine      Family History  Problem Relation Age of Onset  . Breast cancer Mother 77  . Congestive Heart Failure Father   . Diabetes Mellitus II Father   . Hypertension Father   . Rectal cancer Paternal Grandmother     Social History   Socioeconomic History    . Marital status: Single    Spouse name: Not on file  . Number of children: Not on file  . Years of education: Not on file  . Highest education level: Not on file  Occupational History  . Not on file  Tobacco Use  . Smoking status: Current Every Day Smoker    Packs/day: 0.50    Years: 28.00    Pack years: 14.00    Types: Cigarettes  . Smokeless tobacco: Never Used  . Tobacco comment: started around age 68  Vaping Use  . Vaping Use: Never used  Substance and Sexual Activity  . Alcohol use: Not Currently    Comment: Rare - 3-4x/yr  . Drug use: No  . Sexual activity: Yes    Birth control/protection: None  Other Topics Concern  . Not on file  Social History Narrative  . Not on file   Social Determinants of Health   Financial Resource Strain:   . Difficulty of Paying Living Expenses:   Food Insecurity:   . Worried About Charity fundraiser in the Last Year:   . Arboriculturist in the Last Year:   Transportation Needs:   . Film/video editor (Medical):   Marland Kitchen Lack of Transportation (Non-Medical):   Physical Activity:   . Days of Exercise per Week:   .  Minutes of Exercise per Session:   Stress:   . Feeling of Stress :   Social Connections:   . Frequency of Communication with Friends and Family:   . Frequency of Social Gatherings with Friends and Family:   . Attends Religious Services:   . Active Member of Clubs or Organizations:   . Attends Archivist Meetings:   Marland Kitchen Marital Status:   Intimate Partner Violence:   . Fear of Current or Ex-Partner:   . Emotionally Abused:   Marland Kitchen Physically Abused:   . Sexually Abused:     Review of Systems: See HPI, otherwise negative ROS  Physical Exam: BP 133/61   Pulse 72   Temp 97.9 F (36.6 C) (Temporal)   Resp 16   Ht 5\' 4"  (1.626 m)   Wt 116.6 kg   LMP 08/17/2019 (Exact Date) Comment: preg test neg  SpO2 98%   BMI 44.11 kg/m  General:   Alert,  pleasant and cooperative in NAD Head:  Normocephalic and  atraumatic. Neck:  Supple; no masses or thyromegaly. Lungs:  Clear throughout to auscultation, normal respiratory effort.    Heart:  +S1, +S2, Regular rate and rhythm, No edema. Abdomen:  Soft, nontender and nondistended. Normal bowel sounds, without guarding, and without rebound.   Neurologic:  Alert and  oriented x4;  grossly normal neurologically.  Impression/Plan: Lori Ray is here for a colonoscopy to be performed for average risk screening.  Risks, benefits, limitations, and alternatives regarding  colonoscopy have been reviewed with the patient.  Questions have been answered.  All parties agreeable.   Virgel Manifold, MD  09/07/2019, 12:32 PM

## 2019-09-07 NOTE — Anesthesia Preprocedure Evaluation (Addendum)
Anesthesia Evaluation  Patient identified by MRN, date of birth, ID band Patient awake    Reviewed: Allergy & Precautions, NPO status , Patient's Chart, lab work & pertinent test results, reviewed documented beta blocker date and time   History of Anesthesia Complications Negative for: history of anesthetic complications  Airway Mallampati: II  TM Distance: >3 FB Neck ROM: Full    Dental   Pulmonary Current Smoker and Patient abstained from smoking.,    breath sounds clear to auscultation       Cardiovascular hypertension, (-) angina(-) DOE  Rhythm:Regular Rate:Normal   HLD   Neuro/Psych  Headaches, PSYCHIATRIC DISORDERS Depression    GI/Hepatic neg GERD  ,  Endo/Other  diabetes (Pre-DM)  Renal/GU      Musculoskeletal   Abdominal (+) + obese (BMI 46),   Peds  Hematology   Anesthesia Other Findings   Reproductive/Obstetrics                            Anesthesia Physical Anesthesia Plan  ASA: III  Anesthesia Plan: General   Post-op Pain Management:    Induction: Intravenous  PONV Risk Score and Plan: 2 and Propofol infusion, TIVA and Treatment may vary due to age or medical condition  Airway Management Planned: Natural Airway and Nasal Cannula  Additional Equipment:   Intra-op Plan:   Post-operative Plan:   Informed Consent: I have reviewed the patients History and Physical, chart, labs and discussed the procedure including the risks, benefits and alternatives for the proposed anesthesia with the patient or authorized representative who has indicated his/her understanding and acceptance.       Plan Discussed with: CRNA and Anesthesiologist  Anesthesia Plan Comments:         Anesthesia Quick Evaluation

## 2019-09-07 NOTE — Op Note (Signed)
[facility] Patient Name: Lori Ray Procedure Date: 09/07/2019 1:28 PM MRN: [id number] Account #: [id number] Date of Birth: Jan 03, 1971 Admit Type: Outpatient Age: 49 Room: [facility] OR ROOM 01 Gender: Female Note Status: Finalized Procedure:             Colonoscopy Indications:           Screening for colorectal malignant neoplasm Providers:             Conita Amenta B. Bonna Gains MD, MD Medicines:             Monitored Anesthesia Care Complications:         No immediate complications. Procedure:             Pre-Anesthesia Assessment:                        - ASA Grade Assessment: II - A patient with mild                         systemic disease.                        - Prior to the procedure, a History and Physical was                         performed, and patient medications, allergies and                         sensitivities were reviewed. The patient's tolerance                         of previous anesthesia was reviewed.                        - The risks and benefits of the procedure and the                         sedation options and risks were discussed with the                         patient. All questions were answered and informed                         consent was obtained.                        - Patient identification and proposed procedure were                         verified prior to the procedure by the physician, the                         nurse, the anesthesiologist, the anesthetist and the                         technician. The procedure was verified in the                         procedure room.  After obtaining informed consent, the colonoscope was                         passed under direct vision. Throughout the procedure,                         the patient's blood pressure, pulse, and oxygen                         saturations were monitored continuously. The was                         introduced  through the anus and advanced to the the                         cecum, identified by appendiceal orifice and ileocecal                         valve. The colonoscopy was performed with ease. The                         patient tolerated the procedure well. The quality of                         the bowel preparation was good except the sigmoid                         colon was fair. Findings:      The perianal and digital rectal examinations were normal.      A 4 mm polyp was found in the ascending colon. The polyp was flat. The       polyp was removed with a jumbo cold forceps. Resection and retrieval       were complete.      Three sessile polyps were found in the descending colon, transverse       colon and ascending colon. The polyps were 4 to 7 mm in size. These       polyps were removed with a cold snare. Resection and retrieval were       complete.      Five sessile polyps were found in the sigmoid colon. The polyps were 5       to 8 mm in size. These polyps were removed with a cold snare. Resection       and retrieval were complete.      The exam was otherwise without abnormality.      The rectum, sigmoid colon, descending colon, transverse colon, ascending       colon and cecum appeared normal.      The retroflexed view of the distal rectum and anal verge was normal and       showed no anal or rectal abnormalities. Impression:            - One 4 mm polyp in the ascending colon, removed with                         a jumbo cold forceps. Resected and retrieved.                        - Three 4 to  7 mm polyps in the descending colon, in                         the transverse colon and in the ascending colon,                         removed with a cold snare. Resected and retrieved.                        - Five 5 to 8 mm polyps in the sigmoid colon, removed                         with a cold snare. Resected and retrieved.                        - The examination was otherwise  normal.                        - The rectum, sigmoid colon, descending colon,                         transverse colon, ascending colon and cecum are normal.                        - The distal rectum and anal verge are normal on                         retroflexion view. Recommendation:        - Discharge patient to home (with escort).                        - Advance diet as tolerated.                        - Continue present medications.                        - Await pathology results.                        - Repeat colonoscopy in 2 years.                        - The findings and recommendations were discussed with                         the patient.                        - The findings and recommendations were discussed with                         the patient's family.                        - Return to primary care physician as previously                         scheduled. Procedure Code(s):     --- Professional ---  45385, Colonoscopy, flexible; with removal of                         tumor(s), polyp(s), or other lesion(s) by snare                         technique                        45380, 31, Colonoscopy, flexible; with biopsy, single                         or multiple Diagnosis Code(s):     --- Professional ---                        K63.5, Polyp of colon                        Z12.11, Encounter for screening for malignant neoplasm                         of colon CPT copyright 2019 American Medical Association. All rights reserved. The codes documented in this report are preliminary and upon coder review may  be revised to meet current compliance requirements.  Vonda Antigua, MD Margretta Sidle B. Bonna Gains MD, MD 09/07/2019 2:19:10 PM This report has been signed electronically. Number of Addenda: 0 Note Initiated On: 09/07/2019 1:28 PM Scope Withdrawal Time: 0 hours 29 minutes 36 seconds  Total Procedure Duration: 0 hours 34 minutes 17  seconds  Estimated Blood Loss:  Estimated blood loss: none.      [facility]

## 2019-09-07 NOTE — Anesthesia Procedure Notes (Signed)
Date/Time: 09/07/2019 1:34 PM Performed by: Cameron Ali, CRNA Pre-anesthesia Checklist: Patient identified, Emergency Drugs available, Suction available, Timeout performed and Patient being monitored Patient Re-evaluated:Patient Re-evaluated prior to induction Oxygen Delivery Method: Nasal cannula Placement Confirmation: positive ETCO2

## 2019-09-07 NOTE — Anesthesia Postprocedure Evaluation (Signed)
Anesthesia Post Note  Patient: Lori Ray  Procedure(s) Performed: COLONOSCOPY WITH PROPOFOL (N/A ) POLYPECTOMY (N/A Rectum)     Patient location during evaluation: PACU Anesthesia Type: General Level of consciousness: awake and alert Pain management: pain level controlled Vital Signs Assessment: post-procedure vital signs reviewed and stable Respiratory status: spontaneous breathing, nonlabored ventilation, respiratory function stable and patient connected to nasal cannula oxygen Cardiovascular status: blood pressure returned to baseline and stable Postop Assessment: no apparent nausea or vomiting Anesthetic complications: no   No complications documented.  Lori Ray A  Lori Ray

## 2019-09-08 ENCOUNTER — Encounter: Payer: Self-pay | Admitting: Gastroenterology

## 2019-09-09 LAB — SURGICAL PATHOLOGY

## 2019-09-10 ENCOUNTER — Encounter: Payer: Self-pay | Admitting: Gastroenterology

## 2019-09-10 ENCOUNTER — Encounter: Payer: Self-pay | Admitting: Obstetrics and Gynecology

## 2019-09-10 LAB — PATHOLOGY

## 2019-09-17 ENCOUNTER — Encounter: Payer: Self-pay | Admitting: Gastroenterology

## 2020-02-17 ENCOUNTER — Other Ambulatory Visit: Payer: Self-pay

## 2020-02-17 ENCOUNTER — Ambulatory Visit
Admission: RE | Admit: 2020-02-17 | Discharge: 2020-02-17 | Disposition: A | Discharge status: Home / Self Care | Payer: 59 | Source: Ambulatory Visit | Attending: Obstetrics and Gynecology | Admitting: Obstetrics and Gynecology

## 2020-02-17 DIAGNOSIS — Z1231 Encounter for screening mammogram for malignant neoplasm of breast: Secondary | ICD-10-CM | POA: Insufficient documentation

## 2020-02-20 LAB — HM HEPATITIS C SCREENING LAB: HM Hepatitis Screen: NEGATIVE

## 2020-02-20 LAB — HM HIV SCREENING LAB: HM HIV Screening: NEGATIVE

## 2020-11-09 ENCOUNTER — Other Ambulatory Visit (HOSPITAL_COMMUNITY)
Admission: RE | Admit: 2020-11-09 | Discharge: 2020-11-09 | Disposition: A | Discharge status: Home / Self Care | Payer: 59 | Source: Ambulatory Visit | Attending: Obstetrics and Gynecology | Admitting: Obstetrics and Gynecology

## 2020-11-09 ENCOUNTER — Ambulatory Visit (INDEPENDENT_AMBULATORY_CARE_PROVIDER_SITE_OTHER): Payer: 59 | Admitting: Obstetrics and Gynecology

## 2020-11-09 ENCOUNTER — Other Ambulatory Visit: Payer: Self-pay

## 2020-11-09 ENCOUNTER — Encounter: Payer: Self-pay | Admitting: Obstetrics and Gynecology

## 2020-11-09 VITALS — BP 118/88 | Ht 65.0 in | Wt 267.0 lb

## 2020-11-09 DIAGNOSIS — Z87410 Personal history of cervical dysplasia: Secondary | ICD-10-CM

## 2020-11-09 DIAGNOSIS — Z124 Encounter for screening for malignant neoplasm of cervix: Secondary | ICD-10-CM | POA: Diagnosis not present

## 2020-11-09 DIAGNOSIS — Z23 Encounter for immunization: Secondary | ICD-10-CM

## 2020-11-09 DIAGNOSIS — Z1151 Encounter for screening for human papillomavirus (HPV): Secondary | ICD-10-CM | POA: Diagnosis not present

## 2020-11-09 DIAGNOSIS — Z01419 Encounter for gynecological examination (general) (routine) without abnormal findings: Secondary | ICD-10-CM

## 2020-11-09 DIAGNOSIS — Z1231 Encounter for screening mammogram for malignant neoplasm of breast: Secondary | ICD-10-CM

## 2020-11-09 DIAGNOSIS — Z780 Asymptomatic menopausal state: Secondary | ICD-10-CM

## 2020-11-09 DIAGNOSIS — N912 Amenorrhea, unspecified: Secondary | ICD-10-CM

## 2020-11-09 NOTE — Patient Instructions (Signed)
I value your feedback and you entrusting us with your care. If you get a [place] patient survey, I would appreciate you taking the time to let us know about your experience today. Thank you!  [facility]: [phone]      

## 2020-11-09 NOTE — Progress Notes (Signed)
PCP:  Chad Cordial, PA-C   Chief Complaint  Patient presents with   Gynecologic Exam    No concerns     HPI:      [name] is a 50 y.o. G0P0000 who LMP was Patient's last menstrual period was 09/12/2020 (exact date)., presents today for her annual examination.  Her menses are Q3 months with provera challenge, lasting 2 days, minimal blood with wiping only, no dysmen, no BTB.  Had been amenorrheic on OCPs 2017 but no menses off pills. Tried prog challenge and pt had withdrawal bleed. She didn't want to restart OCPs, so has been doing Q3 months provera  given age/BMI.  Hx of menorrhagia in the past.  Sex activity: currently active; partner with vasectomy. Has small cyst on LT labia minora. Stable.  Last Pap: 07/29/19 07/28/18 Results were: no abnormalities /neg HPV DNA. 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 Her mom is BRCA neg, genetic testing not indicated for pt. There is no FH of ovarian cancer. The patient does do self-breast exams.  Tobacco use: The patient currently smokes 1/2-3/4 ppd, knows she needs to quit. Alcohol use: none No drug use.  Exercise: min active. Having foot pain.  Colonoscopy: 2021 with polyps with Dr. Bonna Gains; repeat after 2 yrs  She does get adequate calcium and Vitamin D in her diet.  She has a hx of type 2 DM and hyperlipidemia and was on metformin and lovastatin in past but lost a good bit of wt about 5 yrs ago. She has not been on meds in awhile. Gets labs yearly at work (last one 2021) which were normal/borderline. Plans to do again this fall.   Past Medical History:  Diagnosis Date   Abnormal Pap smear of cervix    Bacterial vaginosis    Breast mass    CIN II (cervical intraepithelial neoplasia II)    Colon polyps 08/2019   on colonoscopy; repeat due after 2 yrs   Diabetes  mellitus ([place])    "pre-diabetic"    HPV (human papilloma virus) anogenital infection    Hyperlipidemia    LGSIL on Pap smear of cervix    Migraine    "hormonal"   Vitamin D deficiency    Vulvar lesion     Past Surgical History:  Procedure Laterality Date   BREAST CYST ASPIRATION Left 2013   CERVICAL BIOPSY  W/ LOOP ELECTRODE EXCISION     COLONOSCOPY WITH PROPOFOL N/A 09/07/2019   Procedure: COLONOSCOPY WITH PROPOFOL;  Surgeon: Virgel Manifold, MD;  Location: [place];  Service: Endoscopy;  Laterality: N/A;   COLPOSCOPY     POLYPECTOMY N/A 09/07/2019   Procedure: POLYPECTOMY;  Surgeon: Virgel Manifold, MD;  Location: [place];  Service: Endoscopy;  Laterality: N/A;    Family History  Problem Relation Age of Onset   Breast cancer Mother 52   Congestive Heart Failure Father    Diabetes Mellitus II Father    Hypertension Father    Rectal cancer Paternal Grandmother     Social History   Socioeconomic History   Marital status: Single    Spouse name: Not on file   Number of children: Not on file   Years of education: Not on file   Highest education level: Not on file  Occupational History   Not on file  Tobacco Use   Smoking status: Every Day    Packs/day: 0.50    Years: 28.00    Pack years: 14.00    Types: Cigarettes   Smokeless tobacco: Never   Tobacco comments:    started around age 59  Vaping Use   Vaping Use: Never used  Substance and Sexual Activity   Alcohol use: Not Currently    Comment: Rare - 3-4x/yr   Drug use: No   Sexual activity: Yes    Birth control/protection: None  Other Topics Concern   Not on file  Social History Narrative   Not on file   Social Determinants of Health   Financial Resource Strain: Not on file  Food Insecurity: Not on file  Transportation Needs: Not on file  Physical Activity: Not on file  Stress: Not on file  Social Connections: Not on file  Intimate Partner Violence: Not on file     Current Meds  Medication Sig   aspirin-acetaminophen-caffeine (EXCEDRIN MIGRAINE) 250-250-65 MG tablet Take by mouth every 6 (six) hours as needed for headache.   Cholecalciferol (VITAMIN D) 2000 units CAPS Take by mouth.   medroxyPROGESTERone (PROVERA) 10 MG tablet TAKE 1 TABLET BY MOUTH ONCE DAILY FOR 7 DAYS EVERY  90  DAYS  IF  NO  MENSES   Multiple Vitamin (MULTI-VITAMIN DAILY PO) Take by mouth.     ROS:  Review of Systems  Constitutional:  Negative for fatigue, fever and unexpected weight change.  Respiratory:  Negative for cough, shortness of breath and wheezing.   Cardiovascular:  Negative for chest pain, palpitations and leg swelling.  Gastrointestinal:  Negative for blood in stool, constipation, diarrhea, nausea and vomiting.  Endocrine: Negative for cold intolerance, heat intolerance and polyuria.  Genitourinary:  Negative for dyspareunia, dysuria, flank pain, frequency, genital sores, hematuria, menstrual problem, pelvic pain, urgency, vaginal bleeding, vaginal discharge and vaginal pain.  Musculoskeletal:  Negative for arthralgias, back pain, joint swelling and myalgias.  Skin:  Negative for rash.  Neurological:  Negative for dizziness, syncope, light-headedness, numbness and headaches.  Hematological:  Negative for adenopathy.  Psychiatric/Behavioral:  Negative for agitation, confusion, sleep disturbance and suicidal ideas. The patient is not nervous/anxious.     Objective: BP 118/88   Ht _0  (1.651 m)   Wt 267 lb (121.1 kg)   LMP 09/12/2020 (Exact Date)   BMI 44.43 kg/m    Physical Exam Constitutional:      Appearance: She is well-developed.  Genitourinary:     Vulva normal.     Right Labia: No rash, tenderness or lesions.    Left Labia: No tenderness, lesions or rash.    No vaginal discharge, erythema or tenderness.      Right Adnexa: not tender and no mass present.    Left Adnexa: not tender and no mass present.    No cervical friability or polyp.      Uterus is not enlarged or tender.  Breasts:    Right: No mass, nipple discharge, skin change or tenderness.     Left: No mass, nipple discharge, skin change or tenderness.  Neck:     Thyroid: No thyromegaly.  Cardiovascular:     Rate and Rhythm: Normal rate and regular rhythm.     Heart sounds: Normal heart sounds. No murmur heard. Pulmonary:     Effort: Pulmonary effort is normal.     Breath sounds: Normal breath sounds.  Abdominal:     Palpations:  Abdomen is soft.     Tenderness: There is no abdominal tenderness. There is no guarding or rebound.  Musculoskeletal:        General: Normal range of motion.     Cervical back: Normal range of motion.  Lymphadenopathy:     Cervical: No cervical adenopathy.  Neurological:     General: No focal deficit present.     Mental Status: She is alert and oriented to person, place, and time.     Cranial Nerves: No cranial nerve deficit.  Skin:    General: Skin is warm and dry.  Psychiatric:        Mood and Affect: Mood normal.        Behavior: Behavior normal.        Thought Content: Thought content normal.        Judgment: Judgment normal.  Vitals reviewed.    Assessment/Plan: Encounter for annual routine gynecological examination  Cervical cancer screening - Plan: Cytology - PAP  Screening for HPV (human papillomavirus) - Plan: Cytology - PAP  History of cervical dysplasia - Plan: Cytology - PAP; repeat pap today.  Encounter for screening mammogram for malignant neoplasm of breast - Plan: MM 3D SCREEN BREAST BILATERAL; pt to sched mammo  Amenorrhea--most likely menopause now at this age; check labs. Scant blood with provera use; can d/c now.   Menopause - Plan: FSH, Estradiol  Need for immunization against influenza - Plan: Flu Vaccine QUAD 58moIM (Fluarix, Fluzone & Alfiuria Quad PF)           GYN counsel mammography screening, menopause, adequate intake of calcium and vitamin D, diet and exercise     F/U  Return in  about 1 year (around 11/09/2021).  Ilse Billman B. Floris Neuhaus, PA-C 11/09/2020 9:30 AM

## 2020-11-10 LAB — CYTOLOGY - PAP
Adequacy: ABSENT
Comment: NEGATIVE
Diagnosis: NEGATIVE
High risk HPV: NEGATIVE

## 2020-11-10 LAB — ESTRADIOL: Estradiol: 20.2 pg/mL

## 2020-11-10 LAB — FOLLICLE STIMULATING HORMONE: FSH: 60.2 m[IU]/mL

## 2021-02-09 ENCOUNTER — Other Ambulatory Visit: Payer: Self-pay

## 2021-02-09 ENCOUNTER — Ambulatory Visit
Admission: RE | Admit: 2021-02-09 | Discharge: 2021-02-09 | Disposition: A | Discharge status: Home / Self Care | Payer: 59 | Source: Ambulatory Visit | Attending: Obstetrics and Gynecology | Admitting: Obstetrics and Gynecology

## 2021-02-09 DIAGNOSIS — Z1231 Encounter for screening mammogram for malignant neoplasm of breast: Secondary | ICD-10-CM | POA: Diagnosis present

## 2021-12-04 NOTE — Progress Notes (Unsigned)
PCP:  Chad Cordial, PA-C   No chief complaint on file.    HPI:      Lori Ray is a 51 y.o. G0P0000 who LMP was No LMP recorded. Patient is perimenopausal., presents today for her annual examination.  Her menses are Q3 months with provera challenge, lasting 2 days, minimal blood with wiping only, no dysmen, no BTB.  Had been amenorrheic on OCPs 2017 but no menses off pills. Tried prog challenge and pt had withdrawal bleed. She didn't want to restart OCPs, so has been doing Q3 months provera  given age/BMI.  Hx of menorrhagia in the past.  Sex activity: currently active; partner with vasectomy. Has small cyst on LT labia minora. Stable.  Last Pap: 11/09/20, 07/29/19, 07/28/18 Results were: no abnormalities /neg HPV DNA. 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 Per ASCCP guidelines, can do Q3 yrs for 25 yrs.  Hx of STDs: HPV  Last mammogram: 02/09/21  Results were: normal--routine follow-up in 12 months There is a FH of breast cancer in her mom at age 15. Her mom is BRCA neg, genetic testing not indicated for pt. There is no FH of ovarian cancer. The patient does do self-breast exams.  Tobacco use: The patient currently smokes 1/2-3/4 ppd, knows she needs to quit. Alcohol use: none No drug use.  Exercise: min active. Having foot pain.  Colonoscopy: 2021 with polyps with Dr. Bonna Gains; repeat after 2 yrs  She does get adequate calcium and Vitamin D in her diet.  She has a hx of type 2 DM and hyperlipidemia and was on metformin and lovastatin in past but lost a good bit of wt about 5 yrs ago. She has not been on meds in awhile. Gets labs yearly at work (last one 2021) which were normal/borderline. Plans to do again this fall.   Past Medical History:  Diagnosis Date   Abnormal Pap smear of cervix    Bacterial vaginosis    CIN II (cervical intraepithelial neoplasia II)    Colon polyps 08/2019   on colonoscopy; repeat due after 2 yrs   Diabetes mellitus ([place])     "pre-diabetic"    HPV (human papilloma virus) anogenital infection    Hyperlipidemia    LGSIL on Pap smear of cervix    Migraine    "hormonal"   Vitamin D deficiency    Vulvar lesion     Past Surgical History:  Procedure Laterality Date   BREAST CYST ASPIRATION Left 2013   CERVICAL BIOPSY  W/ LOOP ELECTRODE EXCISION     COLONOSCOPY WITH PROPOFOL N/A 09/07/2019   Procedure: COLONOSCOPY WITH PROPOFOL;  Surgeon: Virgel Manifold, MD;  Location: [place];  Service: Endoscopy;  Laterality: N/A;   COLPOSCOPY     POLYPECTOMY N/A 09/07/2019   Procedure: POLYPECTOMY;  Surgeon: Virgel Manifold, MD;  Location: [place];  Service: Endoscopy;  Laterality: N/A;    Family History  Problem Relation Age of Onset   Breast cancer Mother 25   Congestive Heart Failure Father    Diabetes Mellitus II Father    Hypertension Father    Rectal cancer Paternal Grandmother     Social History   Socioeconomic History   Marital status: Married    Spouse name: Not on file   Number of children: Not on file   Years of education: Not on file   Highest education level: Not on file  Occupational History   Not on  file  Tobacco Use   Smoking status: Every Day    Packs/day: 0.50    Years: 28.00    Total pack years: 14.00    Types: Cigarettes   Smokeless tobacco: Never   Tobacco comments:    started around age 46  Vaping Use   Vaping Use: Never used  Substance and Sexual Activity   Alcohol use: Not Currently    Comment: Rare - 3-4x/yr   Drug use: No   Sexual activity: Yes    Birth control/protection: None  Other Topics Concern   Not on file  Social History Narrative   Not on file   Social Determinants of Health   Financial Resource Strain: Not on file  Food Insecurity: Not on file  Transportation Needs: Not on file  Physical Activity: Not on file  Stress: Not on file  Social Connections: Not on file  Intimate Partner Violence: Not on file    No outpatient  medications have been marked as taking for the 12/05/21 encounter (Appointment) with Kaysey Berndt, Deirdre Evener, PA-C.     ROS:  Review of Systems  Constitutional:  Negative for fatigue, fever and unexpected weight change.  Respiratory:  Negative for cough, shortness of breath and wheezing.   Cardiovascular:  Negative for chest pain, palpitations and leg swelling.  Gastrointestinal:  Negative for blood in stool, constipation, diarrhea, nausea and vomiting.  Endocrine: Negative for cold intolerance, heat intolerance and polyuria.  Genitourinary:  Negative for dyspareunia, dysuria, flank pain, frequency, genital sores, hematuria, menstrual problem, pelvic pain, urgency, vaginal bleeding, vaginal discharge and vaginal pain.  Musculoskeletal:  Negative for arthralgias, back pain, joint swelling and myalgias.  Skin:  Negative for rash.  Neurological:  Negative for dizziness, syncope, light-headedness, numbness and headaches.  Hematological:  Negative for adenopathy.  Psychiatric/Behavioral:  Negative for agitation, confusion, sleep disturbance and suicidal ideas. The patient is not nervous/anxious.      Objective: There were no vitals taken for this visit.   Physical Exam Constitutional:      Appearance: She is well-developed.  Genitourinary:     Vulva normal.     Right Labia: No rash, tenderness or lesions.    Left Labia: No tenderness, lesions or rash.    No vaginal discharge, erythema or tenderness.      Right Adnexa: not tender and no mass present.    Left Adnexa: not tender and no mass present.    No cervical friability or polyp.     Uterus is not enlarged or tender.  Breasts:    Right: No mass, nipple discharge, skin change or tenderness.     Left: No mass, nipple discharge, skin change or tenderness.  Neck:     Thyroid: No thyromegaly.  Cardiovascular:     Rate and Rhythm: Normal rate and regular rhythm.     Heart sounds: Normal heart sounds. No murmur heard. Pulmonary:      Effort: Pulmonary effort is normal.     Breath sounds: Normal breath sounds.  Abdominal:     Palpations: Abdomen is soft.     Tenderness: There is no abdominal tenderness. There is no guarding or rebound.  Musculoskeletal:        General: Normal range of motion.     Cervical back: Normal range of motion.  Lymphadenopathy:     Cervical: No cervical adenopathy.  Neurological:     General: No focal deficit present.     Mental Status: She is alert and oriented to person, place, and  time.     Cranial Nerves: No cranial nerve deficit.  Skin:    General: Skin is warm and dry.  Psychiatric:        Mood and Affect: Mood normal.        Behavior: Behavior normal.        Thought Content: Thought content normal.        Judgment: Judgment normal.  Vitals reviewed.     Assessment/Plan: Encounter for annual routine gynecological examination  Cervical cancer screening - Plan: Cytology - PAP  Screening for HPV (human papillomavirus) - Plan: Cytology - PAP  History of cervical dysplasia - Plan: Cytology - PAP; repeat pap today.  Encounter for screening mammogram for malignant neoplasm of breast - Plan: MM 3D SCREEN BREAST BILATERAL; pt to sched mammo  Amenorrhea--most likely menopause now at this age; check labs. Scant blood with provera use; can d/c now.   Menopause - Plan: FSH, Estradiol  Need for immunization against influenza - Plan: Flu Vaccine QUAD 67moIM (Fluarix, Fluzone & Alfiuria Quad PF)           GYN counsel mammography screening, menopause, adequate intake of calcium and vitamin D, diet and exercise     F/U  No follow-ups on file.  Georgenia Salim B. Sayge Brienza, PA-C 12/04/2021 9:23 AM

## 2021-12-05 ENCOUNTER — Telehealth: Payer: Self-pay

## 2021-12-05 ENCOUNTER — Other Ambulatory Visit: Payer: Self-pay

## 2021-12-05 ENCOUNTER — Encounter: Payer: Self-pay | Admitting: Obstetrics and Gynecology

## 2021-12-05 ENCOUNTER — Ambulatory Visit (INDEPENDENT_AMBULATORY_CARE_PROVIDER_SITE_OTHER): Payer: 59 | Admitting: Obstetrics and Gynecology

## 2021-12-05 VITALS — BP 122/80 | Ht 65.0 in | Wt 273.0 lb

## 2021-12-05 DIAGNOSIS — Z131 Encounter for screening for diabetes mellitus: Secondary | ICD-10-CM

## 2021-12-05 DIAGNOSIS — Z716 Tobacco abuse counseling: Secondary | ICD-10-CM

## 2021-12-05 DIAGNOSIS — Z0000 Encounter for general adult medical examination without abnormal findings: Secondary | ICD-10-CM

## 2021-12-05 DIAGNOSIS — R7303 Prediabetes: Secondary | ICD-10-CM

## 2021-12-05 DIAGNOSIS — Z1231 Encounter for screening mammogram for malignant neoplasm of breast: Secondary | ICD-10-CM

## 2021-12-05 DIAGNOSIS — Z23 Encounter for immunization: Secondary | ICD-10-CM

## 2021-12-05 DIAGNOSIS — Z1211 Encounter for screening for malignant neoplasm of colon: Secondary | ICD-10-CM

## 2021-12-05 DIAGNOSIS — Z86010 Personal history of colonic polyps: Secondary | ICD-10-CM

## 2021-12-05 DIAGNOSIS — Z1151 Encounter for screening for human papillomavirus (HPV): Secondary | ICD-10-CM

## 2021-12-05 DIAGNOSIS — Z01419 Encounter for gynecological examination (general) (routine) without abnormal findings: Secondary | ICD-10-CM | POA: Diagnosis not present

## 2021-12-05 DIAGNOSIS — Z13220 Encounter for screening for lipoid disorders: Secondary | ICD-10-CM

## 2021-12-05 DIAGNOSIS — Z124 Encounter for screening for malignant neoplasm of cervix: Secondary | ICD-10-CM

## 2021-12-05 DIAGNOSIS — E782 Mixed hyperlipidemia: Secondary | ICD-10-CM

## 2021-12-05 MED ORDER — BUPROPION HCL ER (SR) 150 MG PO TB12: ORAL_TABLET | ORAL | 2 refills | Status: DC

## 2021-12-05 MED ORDER — NA SULFATE-K SULFATE-MG SULF 17.5-3.13-1.6 GM/177ML PO SOLN: 1.0000 | Freq: Once | ORAL | 0 refills | Status: AC

## 2021-12-05 NOTE — Patient Instructions (Addendum)
I value your feedback and you entrusting us with your care. If you get a [place] patient survey, I would appreciate you taking the time to let us know about your experience today. Thank you!  [facility]: [phone]      

## 2021-12-05 NOTE — Telephone Encounter (Signed)
Gastroenterology Pre-Procedure Review  Request Date: 12/18/21 Requesting Physician: Dr. Allen Norris  PATIENT REVIEW QUESTIONS: The patient responded to the following health history questions as indicated:    1. Are you having any GI issues? no 2. Do you have a personal history of Polyps? yes (last colonoscopy performed by Dr. Bonna Gains 08/28/2019) 3. Do you have a family history of Colon Cancer or Polyps? yes (paternal grandmother cancer of the rectum) 4. Diabetes Mellitus? no 5. Joint replacements in the past 12 months?no 6. Major health problems in the past 3 months?no 7. Any artificial heart valves, MVP, or defibrillator?no    MEDICATIONS & ALLERGIES:    Patient reports the following regarding taking any anticoagulation/antiplatelet therapy:   Plavix, Coumadin, Eliquis, Xarelto, Lovenox, Pradaxa, Brilinta, or Effient? no Aspirin? no  Patient confirms/reports the following medications:  Current Outpatient Medications  Medication Sig Dispense Refill   aspirin-acetaminophen-caffeine (EXCEDRIN MIGRAINE) 250-250-65 MG tablet Take by mouth every 6 (six) hours as needed for headache.     buPROPion (WELLBUTRIN SR) 150 MG 12 hr tablet Take 1 tab daily for 3 days, then increase to BID. 60 tablet 2   Cholecalciferol (VITAMIN D-1000 MAX ST) 25 MCG (1000 UT) tablet Take by mouth.     diphenhydrAMINE (BENADRYL) 25 mg capsule Take by mouth.     ibuprofen (ADVIL) 200 MG tablet Take by mouth.     Loratadine 10 MG CAPS Take by mouth.     Multiple Vitamin (MULTI-VITAMIN DAILY PO) Take by mouth.     Omega-3 Fatty Acids (FISH OIL PO) Take by mouth.     No current facility-administered medications for this visit.    Patient confirms/reports the following allergies:  Allergies  Allergen Reactions   Citrus Hives   Sulfadiazine Rash    No orders of the defined types were placed in this encounter.   AUTHORIZATION INFORMATION Primary Insurance: 1D#: Group #:  Secondary Insurance: 1D#: Group  #:  SCHEDULE INFORMATION: Date:  Time: Location:

## 2021-12-06 LAB — COMPREHENSIVE METABOLIC PANEL
ALT: 21 IU/L (ref 0–32)
AST: 16 IU/L (ref 0–40)
Albumin/Globulin Ratio: 1.8 (ref 1.2–2.2)
Albumin: 4.6 g/dL (ref 3.8–4.9)
Alkaline Phosphatase: 72 IU/L (ref 44–121)
BUN/Creatinine Ratio: 19 (ref 9–23)
BUN: 13 mg/dL (ref 6–24)
Bilirubin Total: 0.2 mg/dL (ref 0.0–1.2)
CO2: 25 mmol/L (ref 20–29)
Calcium: 9.3 mg/dL (ref 8.7–10.2)
Chloride: 101 mmol/L (ref 96–106)
Creatinine, Ser: 0.7 mg/dL (ref 0.57–1.00)
Globulin, Total: 2.5 g/dL (ref 1.5–4.5)
Glucose: 112 mg/dL — ABNORMAL HIGH (ref 70–99)
Potassium: 4.3 mmol/L (ref 3.5–5.2)
Sodium: 141 mmol/L (ref 134–144)
Total Protein: 7.1 g/dL (ref 6.0–8.5)
eGFR: 105 mL/min/{1.73_m2} (ref >59)

## 2021-12-06 LAB — HEMOGLOBIN A1C
Est. average glucose Bld gHb Est-mCnc: 131 mg/dL
Hgb A1c MFr Bld: 6.2 % — ABNORMAL HIGH (ref 4.8–5.6)

## 2021-12-06 LAB — LIPID PANEL
Chol/HDL Ratio: 4.6 ratio — ABNORMAL HIGH (ref 0.0–4.4)
Cholesterol, Total: 229 mg/dL — ABNORMAL HIGH (ref 100–199)
HDL: 50 mg/dL (ref >39)
LDL Chol Calc (NIH): 154 mg/dL — ABNORMAL HIGH (ref 0–99)
Triglycerides: 137 mg/dL (ref 0–149)
VLDL Cholesterol Cal: 25 mg/dL (ref 5–40)

## 2021-12-07 NOTE — Addendum Note (Signed)
Addended by: Ardeth Perfect B on: 21/19/4174 01:05 PM   Modules accepted: Orders

## 2021-12-12 ENCOUNTER — Encounter: Payer: Self-pay | Admitting: *Deleted

## 2021-12-13 ENCOUNTER — Encounter: Payer: Self-pay | Admitting: Obstetrics and Gynecology

## 2021-12-18 ENCOUNTER — Ambulatory Visit: Payer: 59 | Admitting: General Practice

## 2021-12-18 ENCOUNTER — Other Ambulatory Visit: Payer: Self-pay

## 2021-12-18 ENCOUNTER — Encounter
Admission: RE | Disposition: A | Discharge status: Home / Self Care | Payer: Self-pay | Source: Home / Self Care | Attending: Gastroenterology

## 2021-12-18 ENCOUNTER — Encounter: Payer: Self-pay | Admitting: Gastroenterology

## 2021-12-18 ENCOUNTER — Ambulatory Visit
Admission: RE | Admit: 2021-12-18 | Discharge: 2021-12-18 | Disposition: A | Discharge status: Home / Self Care | Payer: 59 | Attending: Gastroenterology | Admitting: Gastroenterology

## 2021-12-18 DIAGNOSIS — E785 Hyperlipidemia, unspecified: Secondary | ICD-10-CM | POA: Diagnosis not present

## 2021-12-18 DIAGNOSIS — F17210 Nicotine dependence, cigarettes, uncomplicated: Secondary | ICD-10-CM | POA: Diagnosis not present

## 2021-12-18 DIAGNOSIS — K635 Polyp of colon: Secondary | ICD-10-CM | POA: Diagnosis not present

## 2021-12-18 DIAGNOSIS — Z860100 Personal history of colon polyps, unspecified: Secondary | ICD-10-CM

## 2021-12-18 DIAGNOSIS — E119 Type 2 diabetes mellitus without complications: Secondary | ICD-10-CM | POA: Insufficient documentation

## 2021-12-18 DIAGNOSIS — D125 Benign neoplasm of sigmoid colon: Secondary | ICD-10-CM | POA: Insufficient documentation

## 2021-12-18 DIAGNOSIS — Z6841 Body Mass Index (BMI) 40.0 and over, adult: Secondary | ICD-10-CM | POA: Diagnosis not present

## 2021-12-18 DIAGNOSIS — Z1211 Encounter for screening for malignant neoplasm of colon: Secondary | ICD-10-CM | POA: Insufficient documentation

## 2021-12-18 DIAGNOSIS — D124 Benign neoplasm of descending colon: Secondary | ICD-10-CM | POA: Insufficient documentation

## 2021-12-18 HISTORY — PX: COLONOSCOPY WITH PROPOFOL: SHX5780

## 2021-12-18 SURGERY — COLONOSCOPY WITH PROPOFOL
Anesthesia: General

## 2021-12-18 MED ORDER — LIDOCAINE HCL (CARDIAC) PF 100 MG/5ML IV SOSY
PREFILLED_SYRINGE | INTRAVENOUS | Status: DC | PRN
  Administered 2021-12-18: 100 mg via INTRAVENOUS

## 2021-12-18 MED ORDER — LACTATED RINGERS IV SOLN: INTRAVENOUS | Status: DC

## 2021-12-18 MED ORDER — SODIUM CHLORIDE 0.9 % IV SOLN: INTRAVENOUS | Status: DC

## 2021-12-18 MED ORDER — PROPOFOL 10 MG/ML IV BOLUS
INTRAVENOUS | Status: DC | PRN
  Administered 2021-12-18: 30 mg via INTRAVENOUS
  Administered 2021-12-18: 200 ug/kg/min via INTRAVENOUS
  Administered 2021-12-18: 50 mg via INTRAVENOUS

## 2021-12-18 MED ORDER — STERILE WATER FOR IRRIGATION IR SOLN: Status: DC | PRN

## 2021-12-18 MED ORDER — STERILE WATER FOR IRRIGATION IR SOLN
Status: DC | PRN
  Administered 2021-12-18: 1000 mL

## 2021-12-18 SURGICAL SUPPLY — 21 items

## 2021-12-18 NOTE — Transfer of Care (Signed)
Immediate Anesthesia Transfer of Care Note  Patient: Lori Ray  Procedure(s) Performed: COLONOSCOPY WITH PROPOFOL  Patient Location: PACU  Anesthesia Type: General  Level of Consciousness: awake, alert  and patient cooperative  Airway and Oxygen Therapy: Patient Spontanous Breathing and Patient connected to supplemental oxygen  Post-op Assessment: Post-op Vital signs reviewed, Patient's Cardiovascular Status Stable, Respiratory Function Stable, Patent Airway and No signs of Nausea or vomiting  Post-op Vital Signs: Reviewed and stable  Complications: There were no known notable events for this encounter.

## 2021-12-18 NOTE — Op Note (Signed)
[facility] Gastroenterology Patient Name: Lori Ray Procedure Date: 12/18/2021 8:43 AM MRN: [id number] Account #: [id number] Date of Birth: [birth year] Admit Type: Outpatient Age: 51 Room: [facility] [address] OR ROOM 01 Gender: Female Note Status: Finalized Instrument Name: [id number] Procedure:             Colonoscopy Indications:           High risk colon cancer surveillance: Personal history                         of colonic polyps Providers:             Lucilla Lame MD, MD Referring MD:          Deirdre Evener. Copland (Referring MD) Medicines:             Propofol per Anesthesia Complications:         No immediate complications. Procedure:             Pre-Anesthesia Assessment:                        - Prior to the procedure, a History and Physical was                         performed, and patient medications and allergies were                         reviewed. The patient's tolerance of previous                         anesthesia was also reviewed. The risks and benefits                         of the procedure and the sedation options and risks                         were discussed with the patient. All questions were                         answered, and informed consent was obtained. Prior                         Anticoagulants: The patient has taken no anticoagulant                         or antiplatelet agents. ASA Grade Assessment: II - A                         patient with mild systemic disease. After reviewing                         the risks and benefits, the patient was deemed in                         satisfactory condition to undergo the procedure.                        After obtaining informed consent, the colonoscope was  passed under direct vision. Throughout the procedure,                         the patient's blood pressure, pulse, and oxygen                         saturations were monitored continuously. The                          Colonoscope was introduced through the anus and                         advanced to the the cecum, identified by appendiceal                         orifice and ileocecal valve. The colonoscopy was                         performed without difficulty. The patient tolerated                         the procedure well. The quality of the bowel                         preparation was excellent. Findings:      The perianal and digital rectal examinations were normal.      A 3 mm polyp was found in the ascending colon. The polyp was sessile.       The polyp was removed with a cold biopsy forceps. Resection and       retrieval were complete.      Three sessile polyps were found in the descending colon. The polyps were       3 to 4 mm in size. These polyps were removed with a cold biopsy forceps.       Resection and retrieval were complete.      Four sessile polyps were found in the sigmoid colon. The polyps were 2       to 4 mm in size. These polyps were removed with a cold biopsy forceps.       Resection and retrieval were complete.      A 3 mm polyp was found in the rectum. The polyp was sessile. The polyp       was removed with a cold biopsy forceps. Resection and retrieval were       complete. Impression:            - One 3 mm polyp in the ascending colon, removed with                         a cold biopsy forceps. Resected and retrieved.                        - Three 3 to 4 mm polyps in the descending colon,                         removed with a cold biopsy forceps. Resected and                         retrieved.                        -  Four 2 to 4 mm polyps in the sigmoid colon, removed                         with a cold biopsy forceps. Resected and retrieved.                        - One 3 mm polyp in the rectum, removed with a cold                         biopsy forceps. Resected and retrieved. Recommendation:        - Discharge patient to home.                        - Resume  previous diet.                        - Continue present medications.                        - Await pathology results.                        - Repeat colonoscopy in 3 years for surveillance. Procedure Code(s):     --- Professional ---                        [phone], Colonoscopy, flexible; with biopsy, single or                         multiple Diagnosis Code(s):     --- Professional ---                        Z86.010, Personal history of colonic polyps                        D12.2, Benign neoplasm of ascending colon CPT copyright 2022 American Medical Association. All rights reserved. The codes documented in this report are preliminary and upon coder review may  be revised to meet current compliance requirements. Lucilla Lame MD, MD 12/18/2021 9:08:39 AM This report has been signed electronically. Number of Addenda: 0 Note Initiated On: 12/18/2021 8:43 AM Scope Withdrawal Time: 0 hours 10 minutes 19 seconds  Total Procedure Duration: 0 hours 13 minutes 28 seconds  Estimated Blood Loss:  Estimated blood loss: none.      [facility]

## 2021-12-18 NOTE — H&P (Signed)
Lori Lame, MD [address] [place], [place] [address] [phone] Fax : [phone]  Primary Care Physician:  Amalia Greenhouse Primary Gastroenterologist:  Dr. Allen Norris  Pre-Procedure History & Physical: HPI:  Lori Ray is a 51 y.o. female is here for an colonoscopy.   Past Medical History:  Diagnosis Date   Abnormal Pap smear of cervix    Bacterial vaginosis    CIN II (cervical intraepithelial neoplasia II)    Colon polyps 08/2019   on colonoscopy; repeat due after 2 yrs   Diabetes mellitus ([place])    "pre-diabetic"    HPV (human papilloma virus) anogenital infection    Hyperlipidemia    LGSIL on Pap smear of cervix    Migraine    "hormonal"   Vitamin D deficiency    Vulvar lesion     Past Surgical History:  Procedure Laterality Date   BREAST CYST ASPIRATION Left 2013   CERVICAL BIOPSY  W/ LOOP ELECTRODE EXCISION     COLONOSCOPY WITH PROPOFOL N/A 09/07/2019   Procedure: COLONOSCOPY WITH PROPOFOL;  Surgeon: Virgel Manifold, MD;  Location: [place];  Service: Endoscopy;  Laterality: N/A;   COLPOSCOPY     POLYPECTOMY N/A 09/07/2019   Procedure: POLYPECTOMY;  Surgeon: Virgel Manifold, MD;  Location: [place];  Service: Endoscopy;  Laterality: N/A;    Prior to Admission medications   Medication Sig Start Date End Date Taking? Authorizing Provider  aspirin-acetaminophen-caffeine (EXCEDRIN MIGRAINE) [phone] MG tablet Take by mouth every 6 (six) hours as needed for headache.   Yes [provider]  buPROPion (WELLBUTRIN SR) 150 MG 12 hr tablet Take 1 tab daily for 3 days, then increase to BID. 40/10/27  Yes Copland, Deirdre Evener, PA-C  Cholecalciferol (VITAMIN D-1000 MAX ST) 25 MCG (1000 UT) tablet Take by mouth.   Yes [provider]  diphenhydrAMINE (BENADRYL) 25 mg capsule Take by mouth.   Yes [provider]  ibuprofen (ADVIL) 200 MG tablet Take by mouth.   Yes [provider]   Loratadine 10 MG CAPS Take by mouth.   Yes [provider]  Multiple Vitamin (MULTI-VITAMIN DAILY PO) Take by mouth.   Yes [provider]  Omega-3 Fatty Acids (FISH OIL PO) Take by mouth.   Yes [provider]    Allergies as of 12/05/2021 - Review Complete 12/05/2021  Allergen Reaction Noted   Citrus Hives 08/31/2019   Sulfadiazine Rash     Family History  Problem Relation Age of Onset   Breast cancer Mother 54   Congestive Heart Failure Father    Diabetes Mellitus II Father    Hypertension Father    Rectal cancer Paternal Grandmother     Social History   Socioeconomic History   Marital status: Married    Spouse name: Not on file   Number of children: Not on file   Years of education: Not on file   Highest education level: Not on file  Occupational History   Not on file  Tobacco Use   Smoking status: Every Day    Packs/day: 0.50    Years: 28.00    Total pack years: 14.00    Types: Cigarettes   Smokeless tobacco: Never   Tobacco comments:    started around age 47  Vaping Use   Vaping Use: Never used  Substance and Sexual Activity   Alcohol use: Not Currently    Comment: Rare - 3-4x/yr   Drug use: No  Sexual activity: Yes    Birth control/protection: None  Other Topics Concern   Not on file  Social History Narrative   Not on file   Social Determinants of Health   Financial Resource Strain: Not on file  Food Insecurity: Not on file  Transportation Needs: Not on file  Physical Activity: Not on file  Stress: Not on file  Social Connections: Not on file  Intimate Partner Violence: Not on file    Review of Systems: See HPI, otherwise negative ROS  Physical Exam: BP 137/75   Pulse 84   Temp (!) 97.5 F (36.4 C) (Temporal)   Resp (!) 21   Ht '5\' 5"'$  (1.651 m)   Wt 121.6 kg   LMP 11/20/2019 (Approximate)   SpO2 100%   BMI 44.60 kg/m  General:   Alert,  pleasant and cooperative in NAD Head:  Normocephalic and  atraumatic. Neck:  Supple; no masses or thyromegaly. Lungs:  Clear throughout to auscultation.    Heart:  Regular rate and rhythm. Abdomen:  Soft, nontender and nondistended. Normal bowel sounds, without guarding, and without rebound.   Neurologic:  Alert and  oriented x4;  grossly normal neurologically.  Impression/Plan: Lori Ray is here for an colonoscopy to be performed for a history of adenomatous polyps on 2020   Risks, benefits, limitations, and alternatives regarding  colonoscopy have been reviewed with the patient.  Questions have been answered.  All parties agreeable.   Lori Lame, MD  12/18/2021, 8:41 AM

## 2021-12-18 NOTE — Anesthesia Postprocedure Evaluation (Signed)
Anesthesia Post Note  Patient: Lori Ray  Procedure(s) Performed: COLONOSCOPY WITH PROPOFOL  Patient location during evaluation: PACU Anesthesia Type: General Level of consciousness: awake and alert Pain management: pain level controlled Vital Signs Assessment: post-procedure vital signs reviewed and stable Respiratory status: spontaneous breathing, nonlabored ventilation, respiratory function stable and patient connected to nasal cannula oxygen Cardiovascular status: blood pressure returned to baseline and stable Postop Assessment: no apparent nausea or vomiting Anesthetic complications: no   There were no known notable events for this encounter.   Last Vitals:  Vitals:   12/18/21 0915 12/18/21 0920  BP: 100/65 110/62  Pulse:  75  Resp: 19 19  Temp:    SpO2:  100%    Last Pain:  Vitals:   12/18/21 0911  TempSrc:   PainSc: 0-No pain                 Martha Clan

## 2021-12-18 NOTE — Anesthesia Preprocedure Evaluation (Signed)
Anesthesia Evaluation  Patient identified by MRN, date of birth, ID band Patient awake    Reviewed: Allergy & Precautions, NPO status , Patient's Chart, lab work & pertinent test results, reviewed documented beta blocker date and time   History of Anesthesia Complications Negative for: history of anesthetic complications  Airway Mallampati: II  TM Distance: >3 FB Neck ROM: Full    Dental   Pulmonary neg shortness of breath, neg COPD, neg recent URI, Current Smoker and Patient abstained from smoking.,    breath sounds clear to auscultation       Cardiovascular Exercise Tolerance: Good (-) hypertension(-) angina(-) Past MI, (-) Cardiac Stents and (-) DOE negative cardio ROS  (-) dysrhythmias (-) Valvular Problems/Murmurs Rhythm:Regular Rate:Normal   HLD   Neuro/Psych  Headaches, neg Seizures PSYCHIATRIC DISORDERS Depression    GI/Hepatic negative GI ROS, Neg liver ROS, neg GERD  ,  Endo/Other  diabetes (borderline)Morbid obesity  Renal/GU negative Renal ROS     Musculoskeletal   Abdominal (+) + obese (BMI 73),   Peds  Hematology   Anesthesia Other Findings Past Medical History: No date: Abnormal Pap smear of cervix No date: Bacterial vaginosis No date: CIN II (cervical intraepithelial neoplasia II) 08/2019: Colon polyps     Comment:  on colonoscopy; repeat due after 2 yrs No date: Diabetes mellitus ([facility])     Comment:  "pre-diabetic"  No date: HPV (human papilloma virus) anogenital infection No date: Hyperlipidemia No date: LGSIL on Pap smear of cervix No date: Migraine     Comment:  "hormonal" No date: Vitamin D deficiency No date: Vulvar lesion   Reproductive/Obstetrics                            Anesthesia Physical  Anesthesia Plan  ASA: 3  Anesthesia Plan: General   Post-op Pain Management:    Induction: Intravenous  PONV Risk Score and Plan: 2 and Propofol infusion, TIVA  and Treatment may vary due to age or medical condition  Airway Management Planned: Natural Airway and Nasal Cannula  Additional Equipment:   Intra-op Plan:   Post-operative Plan:   Informed Consent: I have reviewed the patients History and Physical, chart, labs and discussed the procedure including the risks, benefits and alternatives for the proposed anesthesia with the patient or authorized representative who has indicated his/her understanding and acceptance.       Plan Discussed with: CRNA and Anesthesiologist  Anesthesia Plan Comments:         Anesthesia Quick Evaluation

## 2021-12-19 ENCOUNTER — Encounter: Payer: Self-pay | Admitting: Gastroenterology

## 2021-12-20 ENCOUNTER — Encounter: Payer: Self-pay | Admitting: Gastroenterology

## 2021-12-20 LAB — SURGICAL PATHOLOGY

## 2022-02-14 ENCOUNTER — Ambulatory Visit
Admission: RE | Admit: 2022-02-14 | Discharge: 2022-02-14 | Disposition: A | Discharge status: Home / Self Care | Payer: 59 | Source: Ambulatory Visit | Attending: Obstetrics and Gynecology | Admitting: Obstetrics and Gynecology

## 2022-02-14 DIAGNOSIS — Z1231 Encounter for screening mammogram for malignant neoplasm of breast: Secondary | ICD-10-CM | POA: Diagnosis present

## 2022-03-26 ENCOUNTER — Other Ambulatory Visit: Payer: Self-pay | Admitting: Obstetrics and Gynecology

## 2022-03-26 ENCOUNTER — Encounter: Payer: Self-pay | Admitting: Obstetrics and Gynecology

## 2022-03-26 DIAGNOSIS — Z716 Tobacco abuse counseling: Secondary | ICD-10-CM

## 2022-10-30 ENCOUNTER — Telehealth: Payer: Self-pay | Admitting: Family Medicine

## 2022-10-30 ENCOUNTER — Other Ambulatory Visit: Payer: Self-pay | Admitting: Obstetrics and Gynecology

## 2022-10-30 DIAGNOSIS — Z1231 Encounter for screening mammogram for malignant neoplasm of breast: Secondary | ICD-10-CM

## 2022-10-30 NOTE — Telephone Encounter (Signed)
Patient scheduled.

## 2022-10-30 NOTE — Telephone Encounter (Signed)
Please set up when possible.  Thanks.

## 2022-10-30 NOTE — Telephone Encounter (Signed)
Pt called asking if Dr. Para March would accept her as a new pt? Pt stated her sister, Gaspar Cola, is a current pt of Para March. Call back # [phone]

## 2023-02-18 ENCOUNTER — Ambulatory Visit
Admission: RE | Admit: 2023-02-18 | Discharge: 2023-02-18 | Disposition: A | Discharge status: Home / Self Care | Payer: 59 | Source: Ambulatory Visit | Attending: Obstetrics and Gynecology | Admitting: Obstetrics and Gynecology

## 2023-02-18 DIAGNOSIS — Z1231 Encounter for screening mammogram for malignant neoplasm of breast: Secondary | ICD-10-CM | POA: Insufficient documentation

## 2023-04-20 IMAGING — MG MM DIGITAL SCREENING BILAT W/ TOMO AND CAD
8 series · 8 of 24 positions shown · non-contrast
Comparison: Previous exam(s).

CLINICAL DATA: Screening.

EXAM:
DIGITAL SCREENING BILATERAL MAMMOGRAM WITH TOMOSYNTHESIS AND CAD
TECHNIQUE: Bilateral screening digital craniocaudal and mediolateral oblique
mammograms were obtained. Bilateral screening digital breast
tomosynthesis was performed. The images were evaluated with
computer-aided detection.

[R MLO synth-2D]
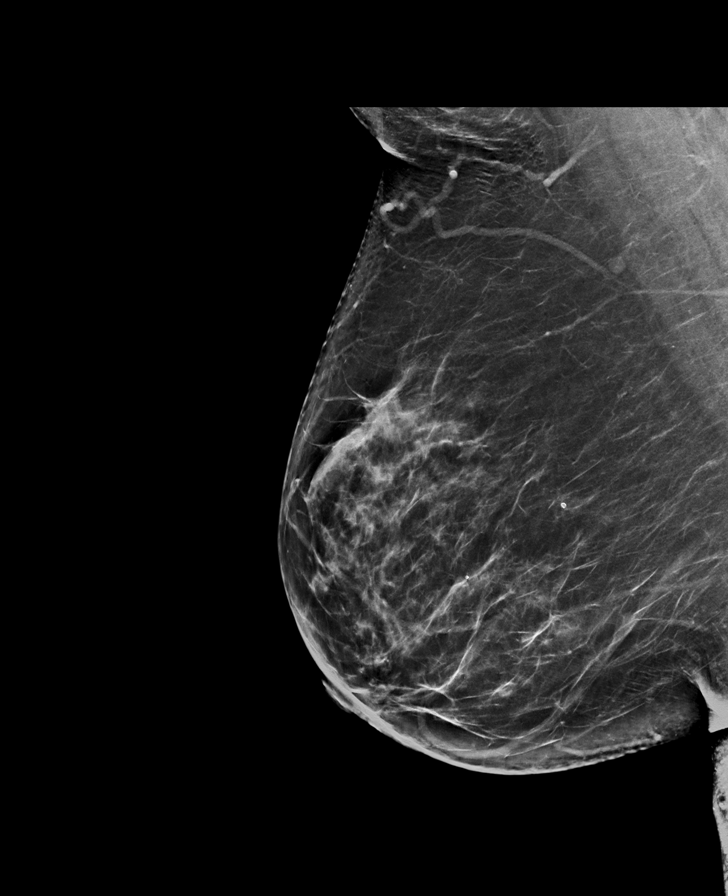

[L MLO synth-2D]
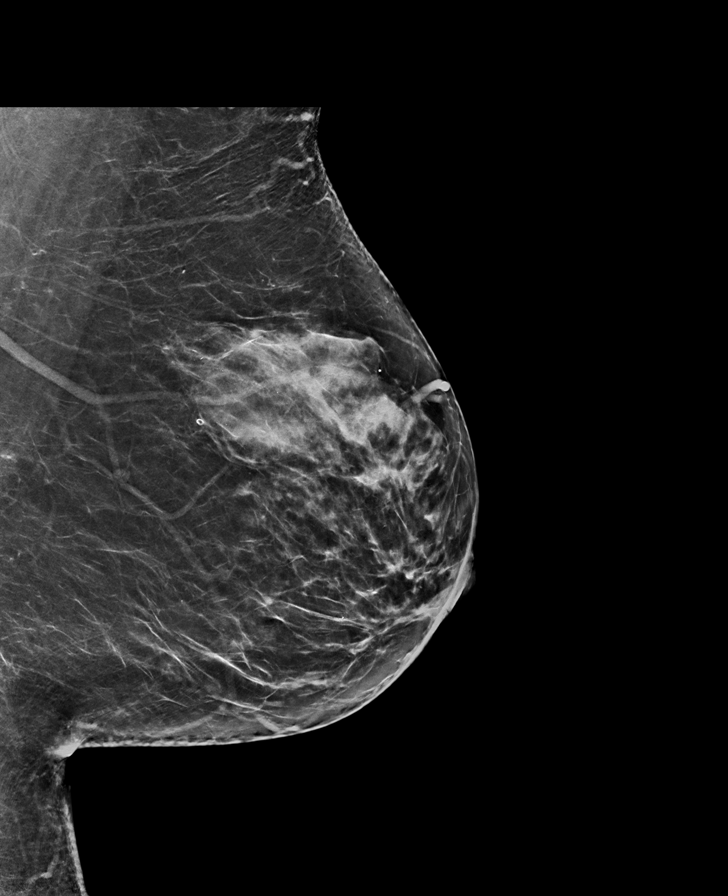

[R CC synth-2D]
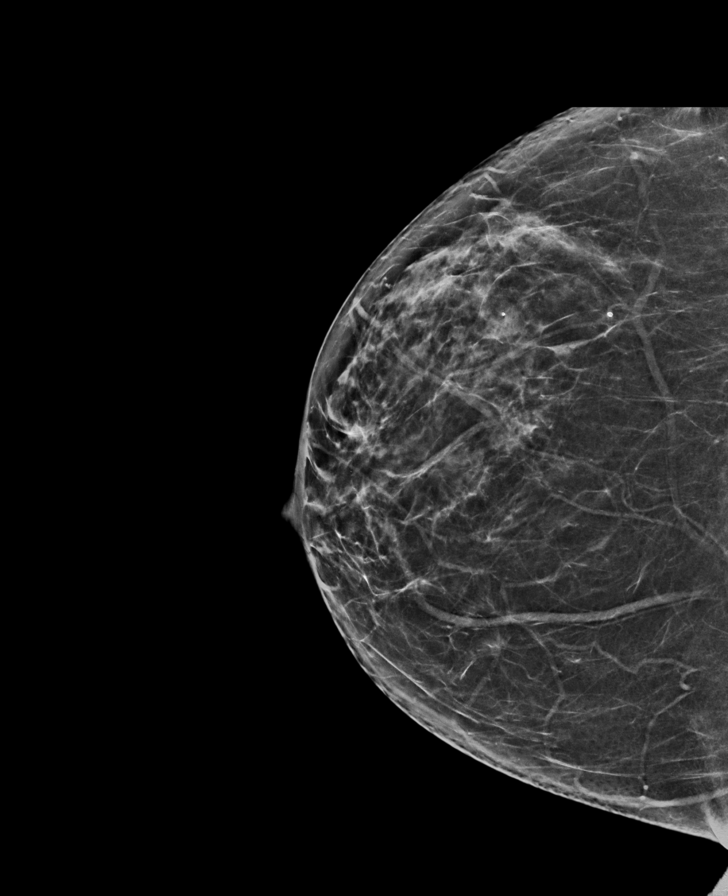

[L CC synth-2D]
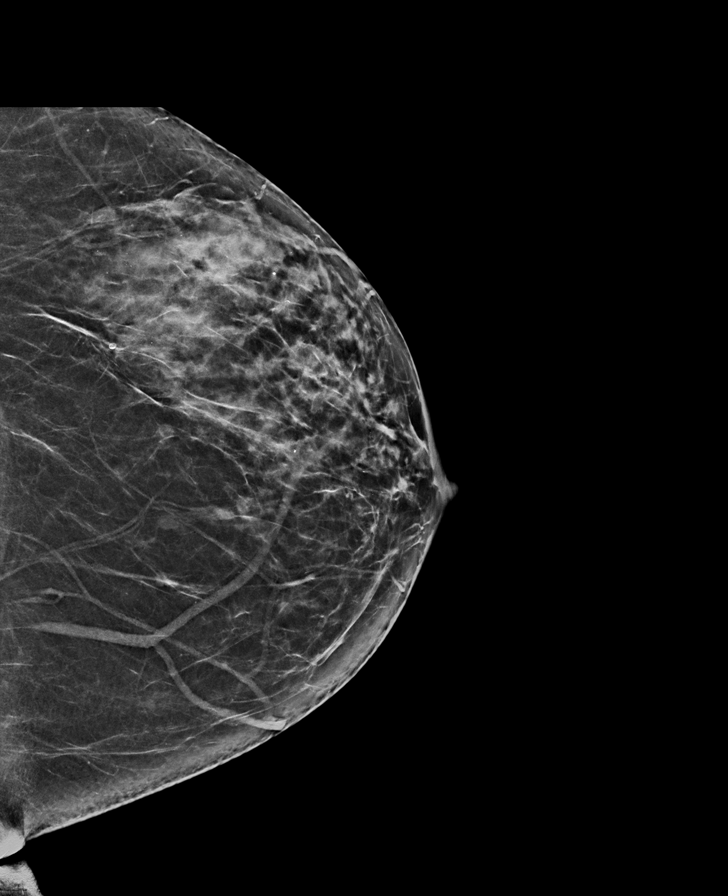

[L CC tomo · tomo slice 34/67.0]
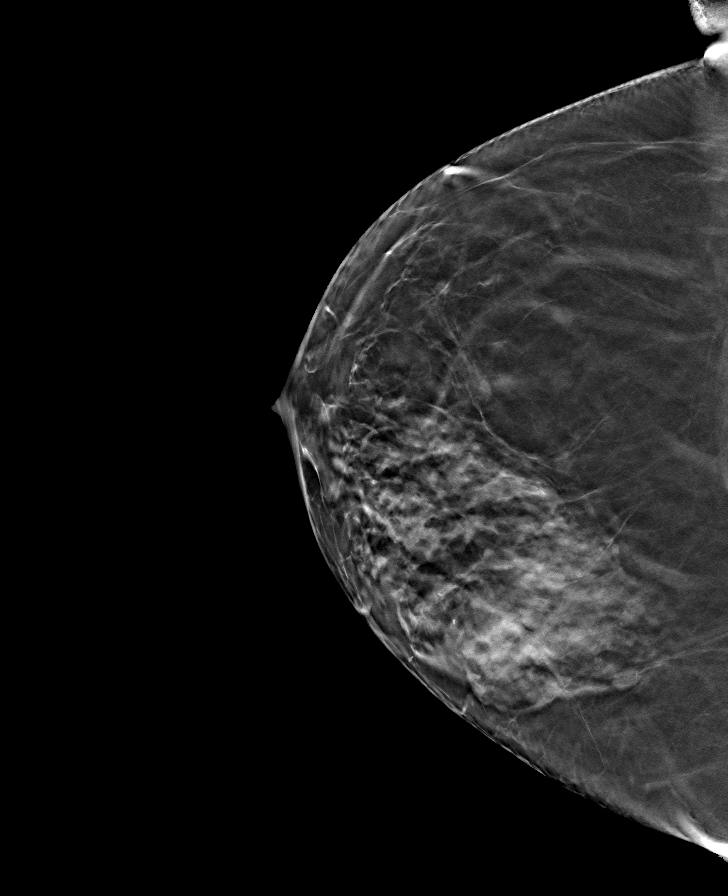

[R CC tomo · tomo slice 35/68.0]
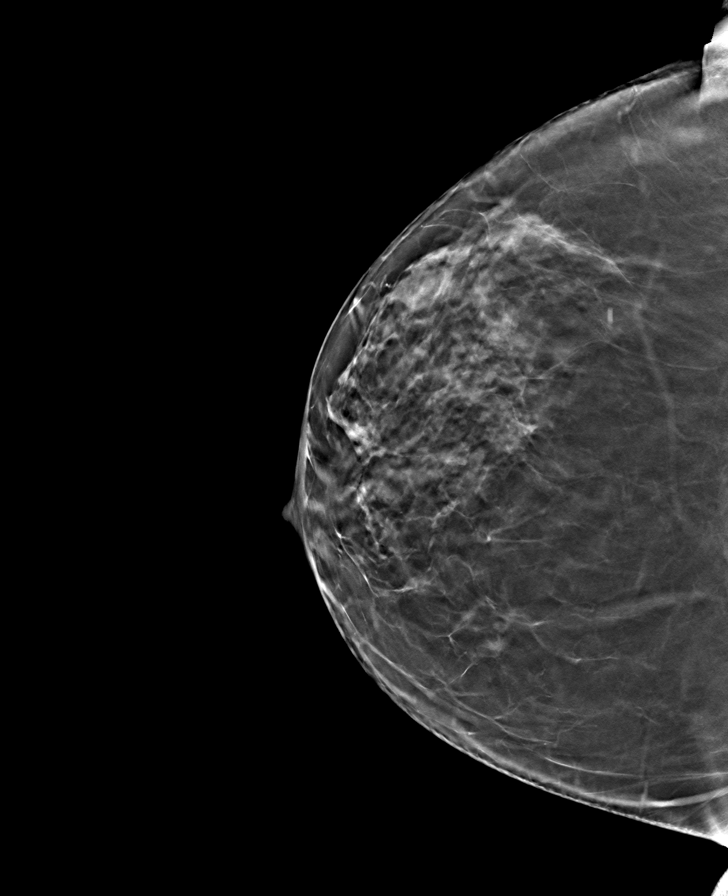

[R MLO tomo · tomo slice 43/86.0]
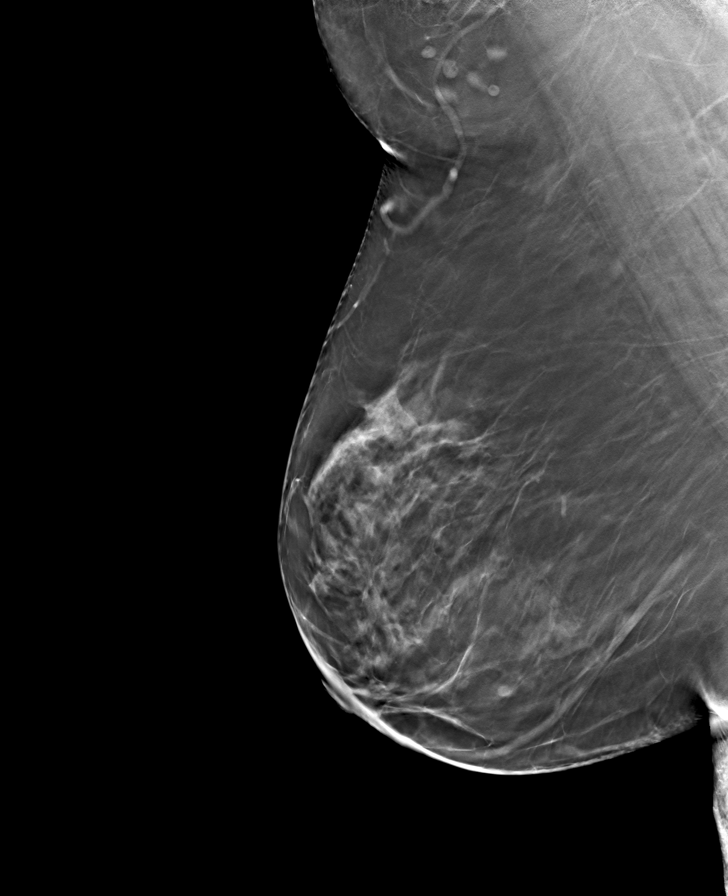

[L MLO tomo · tomo slice 39/78.0]
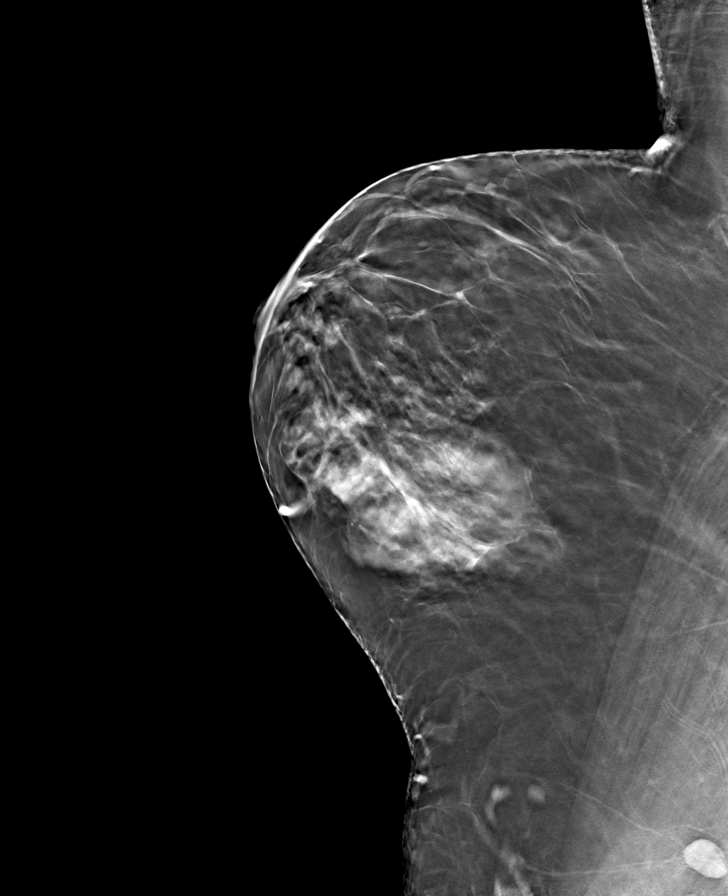

[8 of 24 positions shown; findings below may reference images not displayed]

ACR Breast Density Category c: The breast tissue is heterogeneously
dense, which may obscure small masses.
FINDINGS: There are no findings suspicious for malignancy.
IMPRESSION: No mammographic evidence of malignancy. A result letter of this
screening mammogram will be mailed directly to the patient.

RECOMMENDATION:
Screening mammogram in one year. (Code:Q3-W-BC3)

BI-RADS CATEGORY  1: Negative.

## 2023-04-23 ENCOUNTER — Ambulatory Visit: Payer: 59 | Admitting: Family Medicine

## 2023-04-23 ENCOUNTER — Encounter: Payer: Self-pay | Admitting: Family Medicine

## 2023-04-23 VITALS — BP 114/70 | HR 80 | Temp 99.6°F | Ht 65.16 in | Wt 267.8 lb

## 2023-04-23 DIAGNOSIS — Z0000 Encounter for general adult medical examination without abnormal findings: Secondary | ICD-10-CM

## 2023-04-23 DIAGNOSIS — Z7189 Other specified counseling: Secondary | ICD-10-CM

## 2023-04-23 DIAGNOSIS — Z23 Encounter for immunization: Secondary | ICD-10-CM

## 2023-04-23 DIAGNOSIS — E785 Hyperlipidemia, unspecified: Secondary | ICD-10-CM

## 2023-04-23 DIAGNOSIS — R739 Hyperglycemia, unspecified: Secondary | ICD-10-CM

## 2023-04-23 DIAGNOSIS — L509 Urticaria, unspecified: Secondary | ICD-10-CM

## 2023-04-23 DIAGNOSIS — Z716 Tobacco abuse counseling: Secondary | ICD-10-CM

## 2023-04-23 DIAGNOSIS — G43909 Migraine, unspecified, not intractable, without status migrainosus: Secondary | ICD-10-CM

## 2023-04-23 LAB — LIPID PANEL
Cholesterol: 149 mg/dL (ref 0–200)
HDL: 31.2 mg/dL — ABNORMAL LOW (ref >39.00)
LDL Cholesterol: 96 mg/dL (ref 0–99)
NonHDL: 117.38
Total CHOL/HDL Ratio: 5
Triglycerides: 109 mg/dL (ref 0.0–149.0)
VLDL: 21.8 mg/dL (ref 0.0–40.0)

## 2023-04-23 LAB — COMPREHENSIVE METABOLIC PANEL
ALT: 30 U/L (ref 0–35)
AST: 25 U/L (ref 0–37)
Albumin: 4 g/dL (ref 3.5–5.2)
Alkaline Phosphatase: 46 U/L (ref 39–117)
BUN: 8 mg/dL (ref 6–23)
CO2: 25 meq/L (ref 19–32)
Calcium: 8.9 mg/dL (ref 8.4–10.5)
Chloride: 105 meq/L (ref 96–112)
Creatinine, Ser: 0.52 mg/dL (ref 0.40–1.20)
GFR: 106.4 mL/min (ref >60.00)
Glucose, Bld: 99 mg/dL (ref 70–99)
Potassium: 3.6 meq/L (ref 3.5–5.1)
Sodium: 138 meq/L (ref 135–145)
Total Bilirubin: 0.3 mg/dL (ref 0.2–1.2)
Total Protein: 7 g/dL (ref 6.0–8.3)

## 2023-04-23 LAB — HEMOGLOBIN A1C: Hgb A1c MFr Bld: 6.5 % (ref 4.6–6.5)

## 2023-04-23 NOTE — Patient Instructions (Signed)
Tetanus shot today.  ?Go to the lab on the way out.   If you have mychart we'll likely use that to update you.    ?Take care.  Glad to see you. ?

## 2023-04-23 NOTE — Progress Notes (Unsigned)
 New patient.    Hives w/o lip or tongue swelling.  Longstanding.  Pressure urticaria.  Improved with combination antihistamine blockage.

## 2023-04-24 ENCOUNTER — Encounter: Payer: Self-pay | Admitting: Family Medicine

## 2023-04-24 DIAGNOSIS — Z0000 Encounter for general adult medical examination without abnormal findings: Secondary | ICD-10-CM | POA: Insufficient documentation

## 2023-04-24 DIAGNOSIS — G43909 Migraine, unspecified, not intractable, without status migrainosus: Secondary | ICD-10-CM | POA: Insufficient documentation

## 2023-04-24 DIAGNOSIS — R739 Hyperglycemia, unspecified: Secondary | ICD-10-CM | POA: Insufficient documentation

## 2023-04-24 DIAGNOSIS — L509 Urticaria, unspecified: Secondary | ICD-10-CM | POA: Insufficient documentation

## 2023-04-24 DIAGNOSIS — Z7189 Other specified counseling: Secondary | ICD-10-CM | POA: Insufficient documentation

## 2023-04-24 NOTE — Assessment & Plan Note (Signed)
 Smoking cessation options d/w pt, ie various types of nicotine replacement. She could use gum or nicotine coated toothpicks, d/w pt.  She didn't tolerate patch, wellbutrin.  Had hives with chantix.

## 2023-04-24 NOTE — Assessment & Plan Note (Signed)
  Tetanus 2025 Flu 2024 PNA not due Shingles prev done.  RSV not due Covid vaccine prev done.  DXA not due.  Mammogram 2024 Pap per gyn clinic.  Colonoscopy 2023.  HCV and HIV screening done with blood donation 2022.  Diet and exercise d/w pt.  Husband designated if patient were incapacitated.

## 2023-04-24 NOTE — Assessment & Plan Note (Signed)
 H/o HLD, elevated sugar, see notes on labs.

## 2023-04-24 NOTE — Assessment & Plan Note (Deleted)
  Tetanus 2025 Flu 2024 PNA not due Shingles prev done.  RSV not due Covid vaccine prev done.  DXA not due.  Mammogram 2024 Pap per gyn clinic.  Colonoscopy 2023.  HCV and HIV screening done with blood donation 2022.  Diet and exercise d/w pt.  Husband designated if patient were incapacitated.

## 2023-04-24 NOTE — Assessment & Plan Note (Signed)
Husband designated if patient were incapacitated. 

## 2023-04-24 NOTE — Assessment & Plan Note (Signed)
 Uses exceedrin prn for HA with some relief.  She can update me as needed.

## 2023-04-24 NOTE — Assessment & Plan Note (Signed)
 She describes longstanding h/o pressure urticaria, dermatographism.  Improved with combination antihistamine blockade, d/w pt.  Would continue as is.

## 2023-04-28 ENCOUNTER — Encounter: Payer: Self-pay | Admitting: Family Medicine

## 2023-04-28 DIAGNOSIS — Z8639 Personal history of other endocrine, nutritional and metabolic disease: Secondary | ICD-10-CM | POA: Insufficient documentation

## 2023-04-28 DIAGNOSIS — E119 Type 2 diabetes mellitus without complications: Secondary | ICD-10-CM | POA: Insufficient documentation

## 2023-08-01 ENCOUNTER — Ambulatory Visit: Admitting: Family Medicine

## 2023-08-22 ENCOUNTER — Ambulatory Visit: Admitting: Family Medicine

## 2023-08-22 ENCOUNTER — Encounter: Payer: Self-pay | Admitting: Family Medicine

## 2023-08-22 VITALS — BP 122/80 | HR 87 | Temp 99.4°F | Ht 65.16 in | Wt 268.2 lb

## 2023-08-22 DIAGNOSIS — Z8639 Personal history of other endocrine, nutritional and metabolic disease: Secondary | ICD-10-CM | POA: Diagnosis not present

## 2023-08-22 DIAGNOSIS — E119 Type 2 diabetes mellitus without complications: Secondary | ICD-10-CM

## 2023-08-22 LAB — POCT GLYCOSYLATED HEMOGLOBIN (HGB A1C): Hemoglobin A1C: 6.2 % — AB (ref 4.0–5.6)

## 2023-08-22 NOTE — Progress Notes (Signed)
 Diabetes:  No meds.  Hypoglycemic episodes: no sx  Hyperglycemic episodes: no sx Feet problems: no Blood Sugars averaging: not checked.  A1c d/w pt at OV.  Diet d/w pt.    She was asking about acupuncture for smoking cessation.  Discussed.    Recent cough improving and lungs ctab.    Meds, vitals, and allergies reviewed.   ROS: Per HPI unless specifically indicated in ROS section   GEN: nad, alert and oriented HEENT: ncat NECK: supple w/o LA CV: rrr. PULM: ctab, no inc wob ABD: soft, +bs EXT: no edema SKIN: ncat

## 2023-08-22 NOTE — Patient Instructions (Signed)
 I would get a flu shot each fall.   Keep working on diet and exercise.  Update me as needed.  Take care.  Glad to see you. Recheck at a physical in March/April 2026.  Labs ahead of time if possible.

## 2023-08-22 NOTE — Assessment & Plan Note (Addendum)
 A1c less than 6.5 and d/w pt about diet and exercise.  We can recheck periodically. No change in meds.  She agrees with plan.   D/w pt about smoking cessation.

## 2023-11-08 NOTE — Progress Notes (Signed)
 PCP:  Cleatus Arlyss RAMAN, MD   Chief Complaint  Patient presents with   Gynecologic Exam    No concerns     HPI:      [name] is a 53 y.o. G0P0000 who LMP was Patient's last menstrual period was 11/20/2019 (approximate)., presents today for her annual examination.  Her menses are absent due to menopause. No PMB/pelvic pain, no vasomotor sx. Hx of menorrhagia in the past.  Sex activity: currently active; partner with vasectomy. No pain/bleeding/dryness.  Last Pap: 11/09/20, 07/29/19, 07/28/18 Results were: no abnormalities /neg HPV DNA. Hx of CIN 3 with LEEP 2005 and liked yearly paps. Per ASCCP guidelines, can do Q3 yrs for 25 yrs.  Hx of STDs: HPV  Last mammogram: 02/18/23 Results were: normal--routine follow-up in 12 months There is a FH of breast cancer in her mom at age 69. Her mom is BRCA neg, genetic testing not indicated for pt. There is no FH of ovarian cancer. The patient does do self-breast exams.  Tobacco use: The patient now currently smokes 1/2-1 ppd.  Alcohol use: rare No drug use.  Exercise: min active  Colonoscopy: 10/23 with Dr. Jinny with polyps, repeat after 3 yrs; 2021 with polyps with Dr. Janalyn; repeat after 2 yrs.  She does get adequate calcium and Vitamin D in her diet.  Labs with PCP   Past Medical History:  Diagnosis Date   Bacterial vaginosis    CIN II (cervical intraepithelial neoplasia II)    Colon polyps 08/2019   on colonoscopy; repeat due after 2 yrs   Hyperglycemia    Hyperlipidemia    LGSIL on Pap smear of cervix    Migraine    hormonal   Vitamin D deficiency    Vulvar lesion     Past Surgical History:  Procedure Laterality Date   BREAST CYST ASPIRATION Left 2013   CERVICAL BIOPSY  W/ LOOP ELECTRODE EXCISION     COLONOSCOPY WITH PROPOFOL  N/A 09/07/2019   Procedure: COLONOSCOPY WITH PROPOFOL ;  Surgeon: Janalyn Keene NOVAK, MD;  Location: [facility];  Service: Endoscopy;  Laterality: N/A;   COLONOSCOPY WITH  PROPOFOL  N/A 12/18/2021   Procedure: COLONOSCOPY WITH PROPOFOL ;  Surgeon: Jinny Carmine, MD;  Location: [facility];  Service: Endoscopy;  Laterality: N/A;   COLPOSCOPY     POLYPECTOMY N/A 09/07/2019   Procedure: POLYPECTOMY;  Surgeon: Janalyn Keene NOVAK, MD;  Location: [facility];  Service: Endoscopy;  Laterality: N/A;    Family History  Problem Relation Age of Onset   Breast cancer Mother    Congestive Heart Failure Father    Diabetes Mellitus II Father    Hypertension Father    Colon cancer Paternal Grandmother    Rectal cancer Paternal Grandmother     Social History   Socioeconomic History   Marital status: Married    Spouse name: Not on file   Number of children: Not on file   Years of education: Not on file   Highest education level: Not on file  Occupational History   Not on file  Tobacco Use   Smoking status: Every Day    Current packs/day: 0.50    Average packs/day: 0.5 packs/day for 28.0 years (14.0 ttl pk-yrs)    Types: Cigarettes   Smokeless tobacco: Never   Tobacco comments:    started around age 15  Vaping Use   Vaping status: Never Used  Substance and Sexual Activity   Alcohol use: Yes    Comment:  Rare - 3-4x/yr   Drug use: No   Sexual activity: Yes    Birth control/protection: Post-menopausal  Other Topics Concern   Not on file  Social History Narrative   Married 2022   Lives with husband, they have split custody of his 2 kids   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     Works from home.     [employer] graduate.     Social Drivers of Corporate investment banker Strain: Not on file  Food Insecurity: Not on file  Transportation Needs: Not on file  Physical Activity: Not on file  Stress: Not on file  Social Connections: Not on file  Intimate Partner Violence: Not on file    Current Meds  Medication Sig   aspirin-acetaminophen -caffeine (EXCEDRIN MIGRAINE) 250-250-65 MG tablet Take by mouth every 6 (six) hours as needed for headache.    Cholecalciferol (VITAMIN D-1000 MAX ST) 25 MCG (1000 UT) tablet Take by mouth.   diphenhydrAMINE (BENADRYL) 25 mg capsule Take by mouth.   famotidine (PEPCID) 20 MG tablet Take 20 mg by mouth daily.   Flaxseed, Linseed, (FLAXSEED OIL) 1400 MG CAPS Take 1 Capful by mouth daily.   ibuprofen (ADVIL) 200 MG tablet Take by mouth.   lactobacillus acidophilus (BACID) TABS tablet Take 1 tablet by mouth daily.   Loratadine 10 MG CAPS Take by mouth.   Multiple Vitamin (MULTI-VITAMIN DAILY PO) Take by mouth.     ROS:  Review of Systems  Constitutional:  Negative for fatigue, fever and unexpected weight change.  Respiratory:  Negative for cough, shortness of breath and wheezing.   Cardiovascular:  Negative for chest pain, palpitations and leg swelling.  Gastrointestinal:  Negative for blood in stool, constipation, diarrhea, nausea and vomiting.  Endocrine: Negative for cold intolerance, heat intolerance and polyuria.  Genitourinary:  Negative for dyspareunia, dysuria, flank pain, frequency, genital sores, hematuria, menstrual problem, pelvic pain, urgency, vaginal bleeding, vaginal discharge and vaginal pain.  Musculoskeletal:  Negative for arthralgias, back pain, joint swelling and myalgias.  Skin:  Negative for rash.  Neurological:  Negative for dizziness, syncope, light-headedness, numbness and headaches.  Hematological:  Negative for adenopathy.  Psychiatric/Behavioral:  Negative for agitation, confusion, sleep disturbance and suicidal ideas. The patient is not nervous/anxious.      Objective: BP 118/79   Pulse 86   Ht 5' 4 (1.626 m)   Wt 273 lb (123.8 kg)   LMP 11/20/2019 (Approximate)   BMI 46.86 kg/m    Physical Exam Constitutional:      Appearance: She is well-developed.  Genitourinary:     Vulva normal.     Right Labia: No rash, tenderness or lesions.    Left Labia: No tenderness, lesions or rash.    No vaginal discharge, erythema or tenderness.      Right Adnexa: not  tender and no mass present.    Left Adnexa: not tender and no mass present.    No cervical friability or polyp.     Uterus is not enlarged or tender.  Breasts:    Right: No mass, nipple discharge, skin change or tenderness.     Left: No mass, nipple discharge, skin change or tenderness.  Neck:     Thyroid: No thyromegaly.  Cardiovascular:     Rate and Rhythm: Normal rate and regular rhythm.     Heart sounds: Normal heart sounds. No murmur heard. Pulmonary:     Effort: Pulmonary effort is normal.     Breath sounds: Normal breath sounds.  Abdominal:     Palpations: Abdomen is soft.     Tenderness: There is no abdominal tenderness. There is no guarding or rebound.  Musculoskeletal:        General: Normal range of motion.     Cervical back: Normal range of motion.  Lymphadenopathy:     Cervical: No cervical adenopathy.  Neurological:     General: No focal deficit present.     Mental Status: She is alert and oriented to person, place, and time.     Cranial Nerves: No cranial nerve deficit.  Skin:    General: Skin is warm and dry.  Psychiatric:        Mood and Affect: Mood normal.        Behavior: Behavior normal.        Thought Content: Thought content normal.        Judgment: Judgment normal.  Vitals reviewed.     Assessment/Plan: Encounter for annual routine gynecological examination  Cervical cancer screening - Plan: Cytology - PAP  Screening for HPV (human papillomavirus) - Plan: Cytology - PAP  Encounter for screening mammogram for malignant neoplasm of breast - Plan: [facility] 3D SCREENING MAMMOGRAM BILATERAL BREAST; pt to schedule mammo          GYN counsel mammography screening, menopause, adequate intake of calcium and vitamin D, diet and exercise     F/U  Return in about 1 year (around 11/10/2024).  Ife Vitelli B. Nela Bascom, PA-C 11/11/2023 1:32 PM

## 2023-11-11 ENCOUNTER — Encounter: Payer: Self-pay | Admitting: Obstetrics and Gynecology

## 2023-11-11 ENCOUNTER — Ambulatory Visit: Admitting: Obstetrics and Gynecology

## 2023-11-11 ENCOUNTER — Other Ambulatory Visit (HOSPITAL_COMMUNITY)
Admission: RE | Admit: 2023-11-11 | Discharge: 2023-11-11 | Disposition: A | Source: Ambulatory Visit | Attending: Obstetrics and Gynecology | Admitting: Obstetrics and Gynecology

## 2023-11-11 VITALS — BP 118/79 | HR 86 | Ht 64.0 in | Wt 273.0 lb

## 2023-11-11 DIAGNOSIS — Z01419 Encounter for gynecological examination (general) (routine) without abnormal findings: Secondary | ICD-10-CM

## 2023-11-11 DIAGNOSIS — Z124 Encounter for screening for malignant neoplasm of cervix: Secondary | ICD-10-CM

## 2023-11-11 DIAGNOSIS — Z1231 Encounter for screening mammogram for malignant neoplasm of breast: Secondary | ICD-10-CM

## 2023-11-11 DIAGNOSIS — Z1151 Encounter for screening for human papillomavirus (HPV): Secondary | ICD-10-CM

## 2023-11-11 NOTE — Patient Instructions (Signed)
 I value your feedback and you entrusting [place] with your care. If you get a [facility] patient survey, I would appreciate you taking the time to let us know about your experience today. Thank you!  [facility] ([place])--[phone]

## 2023-11-13 ENCOUNTER — Ambulatory Visit: Payer: Self-pay | Admitting: Obstetrics and Gynecology

## 2023-11-13 LAB — CYTOLOGY - PAP
Adequacy: ABSENT
Comment: NEGATIVE
Diagnosis: NEGATIVE
High risk HPV: NEGATIVE

## 2024-02-19 ENCOUNTER — Ambulatory Visit
Admission: RE | Admit: 2024-02-19 | Discharge: 2024-02-19 | Disposition: A | Discharge status: Home / Self Care | Source: Ambulatory Visit | Attending: Obstetrics and Gynecology | Admitting: Obstetrics and Gynecology

## 2024-02-19 DIAGNOSIS — Z1231 Encounter for screening mammogram for malignant neoplasm of breast: Secondary | ICD-10-CM | POA: Insufficient documentation

## 2024-04-14 ENCOUNTER — Other Ambulatory Visit

## 2024-04-21 ENCOUNTER — Encounter: Admitting: Family Medicine
